# Patient Record
Sex: Male | Born: 1990 | Race: White | Hispanic: No | Marital: Single | Smoking: Never smoker
Health system: Southern US, Community
[De-identification: ages and names within clinical notes are randomized; demographics above are authoritative.]

## PROBLEM LIST (undated history)

## (undated) DIAGNOSIS — J302 Other seasonal allergic rhinitis: Secondary | ICD-10-CM

## (undated) DIAGNOSIS — K760 Fatty (change of) liver, not elsewhere classified: Secondary | ICD-10-CM

## (undated) DIAGNOSIS — K2970 Gastritis, unspecified, without bleeding: Secondary | ICD-10-CM

## (undated) DIAGNOSIS — K219 Gastro-esophageal reflux disease without esophagitis: Secondary | ICD-10-CM

## (undated) DIAGNOSIS — K224 Dyskinesia of esophagus: Secondary | ICD-10-CM

## (undated) HISTORY — DX: Fatty (change of) liver, not elsewhere classified: K76.0

## (undated) HISTORY — DX: Dyskinesia of esophagus: K22.4

## (undated) HISTORY — PX: CIRCUMCISION: SUR203

## (undated) HISTORY — DX: Gastro-esophageal reflux disease without esophagitis: K21.9

## (undated) HISTORY — DX: Gastritis, unspecified, without bleeding: K29.70

---

## 2012-02-17 ENCOUNTER — Encounter (HOSPITAL_BASED_OUTPATIENT_CLINIC_OR_DEPARTMENT_OTHER): Payer: Self-pay | Admitting: *Deleted

## 2012-02-17 ENCOUNTER — Emergency Department (INDEPENDENT_AMBULATORY_CARE_PROVIDER_SITE_OTHER): Payer: Self-pay

## 2012-02-17 ENCOUNTER — Emergency Department (HOSPITAL_BASED_OUTPATIENT_CLINIC_OR_DEPARTMENT_OTHER)
Admission: EM | Admit: 2012-02-17 | Discharge: 2012-02-17 | Disposition: A | Discharge status: Home / Self Care | Payer: Self-pay | Attending: Emergency Medicine | Admitting: Emergency Medicine

## 2012-02-17 DIAGNOSIS — M79609 Pain in unspecified limb: Secondary | ICD-10-CM

## 2012-02-17 DIAGNOSIS — IMO0001 Reserved for inherently not codable concepts without codable children: Secondary | ICD-10-CM | POA: Insufficient documentation

## 2012-02-17 DIAGNOSIS — IMO0002 Reserved for concepts with insufficient information to code with codable children: Secondary | ICD-10-CM | POA: Insufficient documentation

## 2012-02-17 DIAGNOSIS — T148XXA Other injury of unspecified body region, initial encounter: Secondary | ICD-10-CM

## 2012-02-17 DIAGNOSIS — S60229A Contusion of unspecified hand, initial encounter: Secondary | ICD-10-CM | POA: Insufficient documentation

## 2012-02-17 MED ORDER — IBUPROFEN 400 MG PO TABS
600.0000 mg | ORAL_TABLET | Freq: Once | ORAL | Status: AC
  Administered 2012-02-17: 600 mg via ORAL
  Filled 2012-02-17: qty 1
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
## 2013-01-10 ENCOUNTER — Emergency Department (HOSPITAL_COMMUNITY): Payer: Self-pay

## 2013-01-10 ENCOUNTER — Emergency Department (HOSPITAL_COMMUNITY)
Admission: EM | Admit: 2013-01-10 | Discharge: 2013-01-11 | Disposition: A | Discharge status: Home / Self Care | Payer: Self-pay | Attending: Emergency Medicine | Admitting: Emergency Medicine

## 2013-01-10 ENCOUNTER — Encounter (HOSPITAL_COMMUNITY): Payer: Self-pay | Admitting: Emergency Medicine

## 2013-01-10 DIAGNOSIS — J302 Other seasonal allergic rhinitis: Secondary | ICD-10-CM | POA: Insufficient documentation

## 2013-01-10 DIAGNOSIS — K219 Gastro-esophageal reflux disease without esophagitis: Secondary | ICD-10-CM | POA: Insufficient documentation

## 2013-01-10 DIAGNOSIS — R142 Eructation: Secondary | ICD-10-CM | POA: Insufficient documentation

## 2013-01-10 DIAGNOSIS — R079 Chest pain, unspecified: Secondary | ICD-10-CM | POA: Insufficient documentation

## 2013-01-10 DIAGNOSIS — R141 Gas pain: Secondary | ICD-10-CM | POA: Insufficient documentation

## 2013-01-10 DIAGNOSIS — R070 Pain in throat: Secondary | ICD-10-CM | POA: Insufficient documentation

## 2013-01-10 HISTORY — DX: Other seasonal allergic rhinitis: J30.2

## 2013-01-10 LAB — COMPREHENSIVE METABOLIC PANEL
ALT: 51 U/L (ref 0–53)
AST: 43 U/L — ABNORMAL HIGH (ref 0–37)
Alkaline Phosphatase: 66 U/L (ref 39–117)
CO2: 23 mEq/L (ref 19–32)
Chloride: 98 mEq/L (ref 96–112)
GFR calc non Af Amer: >90 mL/min (ref >90)
Glucose, Bld: 103 mg/dL — ABNORMAL HIGH (ref 70–99)
Potassium: 3.9 mEq/L (ref 3.5–5.1)
Sodium: 133 mEq/L — ABNORMAL LOW (ref 135–145)
Total Bilirubin: 0.2 mg/dL — ABNORMAL LOW (ref 0.3–1.2)

## 2013-01-10 LAB — CBC WITH DIFFERENTIAL/PLATELET
Basophils Absolute: 0 10*3/uL (ref 0.0–0.1)
Lymphocytes Relative: 30 % (ref 12–46)
Lymphs Abs: 3.7 10*3/uL (ref 0.7–4.0)
Neutro Abs: 7.2 10*3/uL (ref 1.7–7.7)
Platelets: 305 10*3/uL (ref 150–400)
RBC: 5.64 MIL/uL (ref 4.22–5.81)
RDW: 13.1 % (ref 11.5–15.5)
WBC: 12.5 10*3/uL — ABNORMAL HIGH (ref 4.0–10.5)

## 2013-01-10 LAB — POCT I-STAT TROPONIN I: Troponin i, poc: 0 ng/mL (ref 0.00–0.08)

## 2013-01-10 NOTE — ED Notes (Signed)
Patient complaining of abdominal pain, chest pain, indigestion, and excessive burping.  Denies nausea, vomiting, and diarrhea.

## 2013-01-11 LAB — URINALYSIS, ROUTINE W REFLEX MICROSCOPIC
Bilirubin Urine: NEGATIVE
Glucose, UA: NEGATIVE mg/dL
Hgb urine dipstick: NEGATIVE
Ketones, ur: NEGATIVE mg/dL
Protein, ur: NEGATIVE mg/dL

## 2013-01-11 MED ORDER — PANTOPRAZOLE SODIUM 40 MG IV SOLR
40.0000 mg | Freq: Once | INTRAVENOUS | Status: AC
  Administered 2013-01-11: 40 mg via INTRAVENOUS
  Filled 2013-01-11: qty 40

## 2013-01-11 MED ORDER — SODIUM CHLORIDE 0.9 % IV BOLUS (SEPSIS)
1000.0000 mL | Freq: Once | INTRAVENOUS | Status: AC
  Administered 2013-01-11: 1000 mL via INTRAVENOUS

## 2013-01-11 MED ORDER — FAMOTIDINE 20 MG PO TABS: 20.0000 mg | ORAL_TABLET | Freq: Two times a day (BID) | ORAL | Status: DC

## 2013-01-11 NOTE — ED Provider Notes (Signed)
History     CSN: [id number]  Arrival date & time 01/10/13  2244   First MD Initiated Contact with Patient 01/11/13 0046      Chief Complaint  Patient presents with  . Abdominal Pain  . Chest Pain    (Consider location/radiation/quality/duration/timing/severity/associated sxs/prior treatment) HPI  She presents to the emergency department with complaints of flare of his acid reflux. He says that he has had this problem for years and does not take any medications for it. He states that he used to take some Nexium but he has not been taking it recently. For last 3 days he is having worse than normal epigastric pain, burping, gas, throat burning. He has been having a very acid the entire left face and the back of his throat with a burning. He says that sometimes he feels like he needs to clear his throat because got something in it has not had any cough, cold symptoms, nausea, vomiting, diarrhea or lower abdominal pains. He has not had any weakness or fevers.  Past Medical History  Diagnosis Date  . Seasonal allergies     Past Surgical History  Procedure Date  . Circumcision     History reviewed. No pertinent family history.  History  Substance Use Topics  . Smoking status: Never Smoker   . Smokeless tobacco: Not on file  . Alcohol Use: Yes      Review of Systems  All other systems reviewed and are negative.      Allergies  Review of patient's allergies indicates no known allergies.  Home Medications   Current Outpatient Rx  Name  Route  Sig  Dispense  Refill  . IBUPROFEN 200 MG PO TABS   Oral   Take 600 mg by mouth every 6 (six) hours as needed. For pain           BP 125/76  Pulse 97  Physical Exam  Nursing note and vitals reviewed. Constitutional: He appears well-developed and well-nourished. No distress.  HENT:  Head: Normocephalic and atraumatic.  Eyes: Pupils are equal, round, and reactive to light.  Neck: Normal range of motion. Neck supple.    Cardiovascular: Normal rate and regular rhythm.   Pulmonary/Chest: Effort normal.  Abdominal: Soft.  Neurological: He is alert.  Skin: Skin is warm and dry.    ED Course  Procedures (including critical care time)  Labs Reviewed  CBC WITH DIFFERENTIAL - Abnormal; Notable for the following:    WBC 12.5 (*)     Monocytes Absolute 1.2 (*)     All other components within normal limits  COMPREHENSIVE METABOLIC PANEL - Abnormal; Notable for the following:    Sodium 133 (*)     Glucose, Bld 103 (*)     AST 43 (*)     Total Bilirubin 0.2 (*)     All other components within normal limits  LIPASE, BLOOD  URINALYSIS, ROUTINE W REFLEX MICROSCOPIC  POCT I-STAT TROPONIN I   Dg Chest 2 View  01/10/2013  *RADIOLOGY REPORT*  Clinical Data: Chest and abdominal pain; weakness and nausea.  CHEST - 2 VIEW  Comparison: None.  Findings: The lungs are well-aerated and clear.  There is no evidence of focal opacification, pleural effusion or pneumothorax.  The heart is normal in size; the mediastinal contour is within normal limits.  No acute osseous abnormalities are seen.  IMPRESSION: No acute cardiopulmonary process seen.   Original Report Authenticated By: Santa Lighter, M.D.      No  diagnosis found. Dx: Acid reflux   MDM   Date: 01/11/2013  Rate: 87  Rhythm: normal sinus rhythm  QRS Axis: normal  Intervals: normal  ST/T Wave abnormalities: normal  Conduction Disutrbances:none  Narrative Interpretation:   Old EKG Reviewed: none available  EKG, chest x-ray, troponin and other labs are unremarkable. Patient says he feels a bit better after IV fluids and protonix.  Patient is not having chest pain or abdominal pain. He is having epigastric and throat pain from his GERD. He burps during my examination and describes having by stoamch acid he feeling in the back of his throat. He also expresses having the desire to vomit. The symptoms have been going on intermittently for years and this current  episode that brought him and has been going on for 3 days. Respiratory or cardiac symptoms and no risk factors for cardiac symptoms.  Patient given IV fluids and IV 40 mg Protonix. Discussed how important is that he followup with a gastroenterologist because he most likely needs an endoscopy to evaluate his severe GERD.  Patient discharged with a prescription for Pepcid.  Pt has been advised of the symptoms that warrant their return to the ED. Patient has voiced understanding and has agreed to follow-up with the PCP or specialist.        Linus Mako, [place] 01/11/13 [phone]

## 2013-01-11 NOTE — ED Provider Notes (Signed)
Medical screening examination/treatment/procedure(s) were performed by non-physician practitioner and as supervising physician I was immediately available for consultation/collaboration.  Teressa Lower, MD 01/11/13 [phone]

## 2013-01-13 ENCOUNTER — Encounter: Payer: Self-pay | Admitting: Internal Medicine

## 2013-01-20 ENCOUNTER — Encounter: Payer: Self-pay | Admitting: Internal Medicine

## 2013-01-22 ENCOUNTER — Ambulatory Visit (INDEPENDENT_AMBULATORY_CARE_PROVIDER_SITE_OTHER): Payer: BC Managed Care – PPO | Admitting: Internal Medicine

## 2013-01-22 ENCOUNTER — Encounter: Payer: Self-pay | Admitting: Internal Medicine

## 2013-01-22 VITALS — BP 124/80 | HR 67 | Ht 71.0 in | Wt 259.0 lb

## 2013-01-22 DIAGNOSIS — R1310 Dysphagia, unspecified: Secondary | ICD-10-CM

## 2013-01-22 DIAGNOSIS — R1013 Epigastric pain: Secondary | ICD-10-CM

## 2013-01-22 DIAGNOSIS — K219 Gastro-esophageal reflux disease without esophagitis: Secondary | ICD-10-CM

## 2013-01-22 DIAGNOSIS — K3189 Other diseases of stomach and duodenum: Secondary | ICD-10-CM

## 2013-01-22 MED ORDER — PANTOPRAZOLE SODIUM 40 MG PO TBEC: 40.0000 mg | DELAYED_RELEASE_TABLET | Freq: Every day | ORAL | Status: DC

## 2013-01-22 NOTE — Progress Notes (Signed)
Patient ID: Ian Rojas, male   DOB: 23-May-1991, 22 y.o.   MRN: [id number]  SUBJECTIVE: Ian Rojas is a 22 yo male with little PMH who is seen evaluation of heartburn and epigastric abdominal pain. The patient reports heartburn on off over a 2 year period.  It has been significantly worse over the last 5-6 months. He reports heartburn on a daily basis, associated with water brash and belching. He had previously used Nexium on a very sporadic basis, given him by his grandmother, he did not find this very helpful. His pain worsened and he presented to the ER where he was evaluated and given a prescription for Pepcid 20 mg. He's been using this 2-3 times daily and has found it to be helpful. He still reports globus sensation which is a constant phenomenon and also dysphagia or, solids greater than liquids. He does report some mild nausea but no vomiting. Eating definitely makes his symptoms worse, and he has changed his diet to avoid acidic foods such as tomatoes.  Overeating also worsens his heartburn. Separate from his heartburn he reports epigastric and right upper quadrant pain is worse after eating. This pain can be quite severe. He has been using baking soda with water in the morning to help his symptoms. No odynophagia. No fevers or chills. No black or tarry stools.  Review of Systems  As per history of present illness, otherwise negative   Past Medical History  Diagnosis Date  . Seasonal allergies   . GERD (gastroesophageal reflux disease)     Current Outpatient Prescriptions  Medication Sig Dispense Refill  . famotidine (PEPCID) 20 MG tablet Take 1 tablet (20 mg total) by mouth 2 (two) times daily.  30 tablet  0  . pantoprazole (PROTONIX) 40 MG tablet Take 1 tablet (40 mg total) by mouth daily.  60 tablet  3    No Known Allergies  Family History  Problem Relation Age of Onset  . Colon cancer Neg Hx   . Colitis Father     History  Substance Use Topics  . Smoking status: Never  Smoker   . Smokeless tobacco: Never Used  . Alcohol Use: No    OBJECTIVE: BP 124/80  Pulse 67  Ht 5' 11"  (1.803 m)  Wt 259 lb (117.482 kg)  BMI 36.12 kg/m2 Constitutional: Well-developed and well-nourished. No distress. HEENT: Normocephalic and atraumatic. Oropharynx is clear and moist. No oropharyngeal exudate. Conjunctivae are normal. No scleral icterus. Neck: Neck supple. Trachea midline. Cardiovascular: Normal rate, regular rhythm and intact distal pulses. No M/R/G Pulmonary/chest: Effort normal and breath sounds normal. No wheezing, rales or rhonchi. Abdominal: Soft, mild upper abdominal tenderness without rebound or guarding, nondistended. Bowel sounds active throughout. There are no masses palpable. No hepatosplenomegaly. Extremities: no clubbing, cyanosis, or edema Lymphadenopathy: No cervical adenopathy noted. Neurological: Alert and oriented to person place and time. Skin: Skin is warm and dry. No rashes noted. Psychiatric: Normal mood and affect. Behavior is normal.  Labs and Imaging -- CBC    Component Value Date/Time   WBC 12.5* 01/10/2013 2304   RBC 5.64 01/10/2013 2304   HGB 15.7 01/10/2013 2304   HCT 44.9 01/10/2013 2304   PLT 305 01/10/2013 2304   MCV 79.6 01/10/2013 2304   MCH 27.8 01/10/2013 2304   MCHC 35.0 01/10/2013 2304   RDW 13.1 01/10/2013 2304   LYMPHSABS 3.7 01/10/2013 2304   MONOABS 1.2* 01/10/2013 2304   EOSABS 0.4 01/10/2013 2304   BASOSABS 0.0 01/10/2013 2304  CMP     Component Value Date/Time   NA 133* 01/10/2013 2304   K 3.9 01/10/2013 2304   CL 98 01/10/2013 2304   CO2 23 01/10/2013 2304   GLUCOSE 103* 01/10/2013 2304   BUN 11 01/10/2013 2304   CREATININE 0.68 01/10/2013 2304   CALCIUM 9.4 01/10/2013 2304   PROT 8.2 01/10/2013 2304   ALBUMIN 4.3 01/10/2013 2304   AST 43* 01/10/2013 2304   ALT 51 01/10/2013 2304   ALKPHOS 66 01/10/2013 2304   BILITOT 0.2* 01/10/2013 2304   GFRNONAA >90 01/10/2013 2304   GFRAA >90 01/10/2013 2304    CXR: no acute  disease  ASSESSMENT AND PLAN: 22 year old seen for evaluation of GERD and ulcer-like dyspepsia  1.  GERD/dyspepsia -- he doesn't have symptoms consistent with GERD and epigastric abdominal pain consistent with ulcer-like dyspepsia. Given his dysphagia symptoms and epigastric, pain, I recommended upper endoscopy.  This will help rule out esophagitis gastritis, duodenitis and ulcer disease.  Will also allow [place] to biopsy for H. pylori, should inflammation be seen. I would like to start him on pantoprazole 40 mg twice daily. He is instructed to take this 30 minutes to one hour before his first and last meal of the day. We did discuss GERD hygiene including not eating or drinking within 2 hours of lying down, avoiding acidic foods, and other trigger foods such as caffeine and alcohol.  GERD diet handout provided. Continue twice daily PPI until EGD, and at that point we likely can decrease to once daily. He will hold Pepcid for now we can be used for breakthrough symptoms.

## 2013-01-22 NOTE — Patient Instructions (Addendum)
You have been scheduled for an endoscopy with propofol. Please follow written instructions given to you at your visit today. If you use inhalers (even only as needed) or a CPAP machine, please bring them with you on the day of your procedure.  We have sent the following medications to your pharmacy for you to pick up at your convenience: Protonix, take 1 capsule 30 minutes before first meal of the day and 1 capsule before dinner  Diet for Gastroesophageal Reflux Disease, Adult Reflux (acid reflux) is when acid from your stomach flows up into the esophagus. When acid comes in contact with the esophagus, the acid causes irritation and soreness (inflammation) in the esophagus. When reflux happens often or so severely that it causes damage to the esophagus, it is called gastroesophageal reflux disease (GERD). Nutrition therapy can help ease the discomfort of GERD. FOODS OR DRINKS TO AVOID OR LIMIT  Smoking or chewing tobacco. Nicotine is one of the most potent stimulants to acid production in the gastrointestinal tract.  Caffeinated and decaffeinated coffee and black tea.  Regular or low-calorie carbonated beverages or energy drinks (caffeine-free carbonated beverages are allowed).   Strong spices, such as black pepper, white pepper, red pepper, cayenne, curry powder, and chili powder.  Peppermint or spearmint.  Chocolate.  High-fat foods, including meats and fried foods. Extra added fats including oils, butter, salad dressings, and nuts. Limit these to less than 8 tsp per day.  Fruits and vegetables if they are not tolerated, such as citrus fruits or tomatoes.  Alcohol.  Any food that seems to aggravate your condition. If you have questions regarding your diet, call your caregiver or a registered dietitian. OTHER THINGS THAT MAY HELP GERD INCLUDE:   Eating your meals slowly, in a relaxed setting.  Eating 5 to 6 small meals per day instead of 3 large meals.  Eliminating food for a  period of time if it causes distress.  Not lying down until 3 hours after eating a meal.  Keeping the head of your bed raised 6 to 9 inches (15 to 23 cm) by using a foam wedge or blocks under the legs of the bed. Lying flat may make symptoms worse.  Being physically active. Weight loss may be helpful in reducing reflux in overweight or obese adults.  Wear loose fitting clothing EXAMPLE MEAL PLAN This meal plan is approximately 2,000 calories based on [email or web address] meal planning guidelines. Breakfast   cup cooked oatmeal.  1 cup strawberries.  1 cup low-fat milk.  1 oz almonds. Snack  1 cup cucumber slices.  6 oz yogurt (made from low-fat or fat-free milk). Lunch  2 slice whole-wheat bread.  2 oz sliced [place].  2 tsp mayonnaise.  1 cup blueberries.  1 cup snap peas. Snack  6 whole-wheat crackers.  1 oz string cheese. Dinner   cup brown rice.  1 cup mixed veggies.  1 tsp olive oil.  3 oz grilled fish. Document Released: 12/04/2005 Document Revised: 02/26/2012 Document Reviewed: 10/20/2011 [facility] Patient Information 2013 [place].

## 2013-01-23 ENCOUNTER — Other Ambulatory Visit: Payer: Self-pay | Admitting: Gastroenterology

## 2013-01-23 DIAGNOSIS — R1310 Dysphagia, unspecified: Secondary | ICD-10-CM

## 2013-01-23 DIAGNOSIS — R1013 Epigastric pain: Secondary | ICD-10-CM

## 2013-01-23 MED ORDER — PANTOPRAZOLE SODIUM 40 MG PO TBEC: 40.0000 mg | DELAYED_RELEASE_TABLET | Freq: Every day | ORAL | Status: DC

## 2013-02-03 ENCOUNTER — Encounter: Payer: Self-pay | Admitting: Internal Medicine

## 2013-02-03 ENCOUNTER — Ambulatory Visit (AMBULATORY_SURGERY_CENTER): Payer: BC Managed Care – PPO | Admitting: Internal Medicine

## 2013-02-03 VITALS — BP 133/75 | HR 66 | Temp 96.7°F | Resp 18 | Ht 71.0 in | Wt 259.0 lb

## 2013-02-03 DIAGNOSIS — D130 Benign neoplasm of esophagus: Secondary | ICD-10-CM

## 2013-02-03 DIAGNOSIS — R1310 Dysphagia, unspecified: Secondary | ICD-10-CM

## 2013-02-03 DIAGNOSIS — K2990 Gastroduodenitis, unspecified, without bleeding: Secondary | ICD-10-CM

## 2013-02-03 DIAGNOSIS — K2970 Gastritis, unspecified, without bleeding: Secondary | ICD-10-CM

## 2013-02-03 DIAGNOSIS — K219 Gastro-esophageal reflux disease without esophagitis: Secondary | ICD-10-CM

## 2013-02-03 DIAGNOSIS — K3189 Other diseases of stomach and duodenum: Secondary | ICD-10-CM

## 2013-02-03 DIAGNOSIS — R1013 Epigastric pain: Secondary | ICD-10-CM

## 2013-02-03 MED ORDER — SODIUM CHLORIDE 0.9 % IV SOLN: 500.0000 mL | INTRAVENOUS | Status: DC

## 2013-02-03 NOTE — Op Note (Signed)
[place]  [address] [place], [address]   ENDOSCOPY PROCEDURE REPORT  PATIENT: Ian, Rojas  MR#: [id number] BIRTHDATE: 1991-05-12 , 21  yrs. old GENDER: Male ENDOSCOPIST: Jerene Bears, MD PROCEDURE DATE:  02/03/2013 PROCEDURE:  EGD w/ biopsy ASA CLASS:     Class I INDICATIONS:  Heartburn.   Epigastric pain.   Dysphagia. MEDICATIONS: MAC sedation, administered by CRNA and propofol (Diprivan) 446m IV TOPICAL ANESTHETIC: Cetacaine Spray  DESCRIPTION OF PROCEDURE: After the risks benefits and alternatives of the procedure were thoroughly explained, informed consent was obtained.  The LB GIF-H180 [id number] was introduced through the mouth and advanced to the second portion of the duodenum. Without limitations.  The instrument was slowly withdrawn as the mucosa was fully examined.    ESOPHAGUS: The mucosa of the esophagus appeared normal.  Multiple biopsies were taken in the distal and mid esophagus to rule out eosinophilic esophagitis.   A normal Z-line was observed 40 cm from the incisors.   There was reflux of gastric fluid noted during the exam.  STOMACH: Mild erosive gastritis (inflammation) was found in the gastric antrum.  Multiple biopsies were performed using cold forceps.  Sample sent for histology.   Stomach otherwise normal.  DUODENUM: The duodenal mucosa showed no abnormalities in the bulb and second portion of the duodenum.  Retroflexed views revealed no abnormalities.     The scope was then withdrawn from the patient and the procedure completed.  COMPLICATIONS: There were no complications.  ENDOSCOPIC IMPRESSION: 1.   The mucosa of the esophagus appeared normal; multiple biopsies were taken in the distal and mid esophagus to rule out eosinophilic esophagitis 2.   Normal Z-line was observed 40 cm from the incisors 3.   Mild erosive gastritis (inflammation) was found in the gastric antrum; multiple biopsies 4.   The  duodenal mucosa showed no abnormalities in the bulb and second portion of the duodenum      RECOMMENDATIONS: 1.  Await pathology results 2.  Follow-up of helicobacter pylori status, treat if indicated 3.  Continue taking your PPI (pantoprazole) twice daily for a total of 4-8 weeks.  It is best to be taken 20-30 minutes prior to first and last meal of the day.  After this you can switch to once daily dosing to control acid reflux (heartburn) 4.  Outpatient follow-up is advised on an as needed basis.  eSigned:  JJerene Bears MD 02/03/2013 8:40 AM  CC:The Patient  PATIENT NAME:  Ian, KowalewskiMR#: [id number]

## 2013-02-03 NOTE — Progress Notes (Signed)
Patient did not experience any of the following events: a burn prior to discharge; a fall within the facility; wrong site/side/patient/procedure/implant event; or a hospital transfer or hospital admission upon discharge from the facility. (G8907) Patient did not have preoperative order for IV antibiotic SSI prophylaxis. (G8918)  

## 2013-02-03 NOTE — Progress Notes (Signed)
PT WILL NEED A WORK NOTE FOR TODAY. PT STATES HE HAS TO WORK TODAY, PT ADVISED AGAINST THIS DUE TO SEDATION.  EWM PT NOT ALLERGIC TO EGGS OR SOY. EWM

## 2013-02-03 NOTE — Patient Instructions (Addendum)
YOU HAD AN ENDOSCOPIC PROCEDURE TODAY AT [facility]: Refer to the procedure report that was given to you for any specific questions about what was found during the examination.  If the procedure report does not answer your questions, please call your gastroenterologist to clarify.  If you requested that your care partner not be given the details of your procedure findings, then the procedure report has been included in a sealed envelope for you to review at your convenience later.  YOU SHOULD EXPECT: Some feelings of bloating in the abdomen. Passage of more gas than usual.  Walking can help get rid of the air that was put into your GI tract during the procedure and reduce the bloating. If you had a lower endoscopy (such as a colonoscopy or flexible sigmoidoscopy) you may notice spotting of blood in your stool or on the toilet paper. If you underwent a bowel prep for your procedure, then you may not have a normal bowel movement for a few days.  DIET: Your first meal following the procedure should be a light meal and then it is ok to progress to your normal diet.  A half-sandwich or bowl of soup is an example of a good first meal.  Heavy or fried foods are harder to digest and may make you feel nauseous or bloated.  Likewise meals heavy in dairy and vegetables can cause extra gas to form and this can also increase the bloating.  Drink plenty of fluids but you should avoid alcoholic beverages for 24 hours.  ACTIVITY: Your care partner should take you home directly after the procedure.  You should plan to take it easy, moving slowly for the rest of the day.  You can resume normal activity the day after the procedure however you should NOT DRIVE or use heavy machinery for 24 hours (because of the sedation medicines used during the test).    SYMPTOMS TO REPORT IMMEDIATELY: A gastroenterologist can be reached at any hour.  During normal business hours, 8:30 AM to 5:00 PM Monday through Friday,  call [phone].  After hours and on weekends, please call the GI answering service at [phone] who will take a message and have the physician on call contact you.    Following upper endoscopy (EGD)  Vomiting of blood or coffee ground material  New chest pain or pain under the shoulder blades  Painful or persistently difficult swallowing  New shortness of breath  Fever of 100F or higher  Black, tarry-looking stools  FOLLOW UP: If any biopsies were taken you will be contacted by phone or by letter within the next 1-3 weeks.  Call your gastroenterologist if you have not heard about the biopsies in 3 weeks.  Our staff will call the home number listed on your records the next business day following your procedure to check on you and address any questions or concerns that you may have at that time regarding the information given to you following your procedure. This is a courtesy call and so if there is no answer at the home number and we have not heard from you through the emergency physician on call, we will assume that you have returned to your regular daily activities without incident.  Resume medications. Information given on gastritis with discharge instructions.  SIGNATURES/CONFIDENTIALITY: You and/or your care partner have signed paperwork which will be entered into your electronic medical record.  These signatures attest to the fact that that the information above on your After  Visit Summary has been reviewed and is understood.  Full responsibility of the confidentiality of this discharge information lies with you and/or your care-partner.

## 2013-02-03 NOTE — Progress Notes (Signed)
Called to room to assist during endoscopic procedure.  Patient ID and intended procedure confirmed with present staff. Received instructions for my participation in the procedure from the performing physician.  

## 2013-02-04 ENCOUNTER — Telehealth: Payer: Self-pay | Admitting: *Deleted

## 2013-02-04 NOTE — Telephone Encounter (Signed)
No answer, no voice mail sent up per recording.

## 2013-02-07 ENCOUNTER — Encounter: Payer: Self-pay | Admitting: Internal Medicine

## 2013-05-30 ENCOUNTER — Other Ambulatory Visit: Payer: Self-pay | Admitting: Internal Medicine

## 2013-06-19 ENCOUNTER — Other Ambulatory Visit: Payer: Self-pay | Admitting: Physician Assistant

## 2013-06-19 DIAGNOSIS — R109 Unspecified abdominal pain: Secondary | ICD-10-CM

## 2013-06-24 ENCOUNTER — Ambulatory Visit
Admission: RE | Admit: 2013-06-24 | Discharge: 2013-06-24 | Disposition: A | Discharge status: Home / Self Care | Payer: BC Managed Care – PPO | Source: Ambulatory Visit | Attending: Physician Assistant | Admitting: Physician Assistant

## 2013-06-24 ENCOUNTER — Other Ambulatory Visit: Payer: Self-pay | Admitting: Physician Assistant

## 2013-06-24 DIAGNOSIS — R109 Unspecified abdominal pain: Secondary | ICD-10-CM

## 2013-07-22 ENCOUNTER — Other Ambulatory Visit: Payer: Self-pay | Admitting: Gastroenterology

## 2013-07-22 DIAGNOSIS — K760 Fatty (change of) liver, not elsewhere classified: Secondary | ICD-10-CM

## 2013-07-22 DIAGNOSIS — R1012 Left upper quadrant pain: Secondary | ICD-10-CM

## 2013-07-28 ENCOUNTER — Ambulatory Visit
Admission: RE | Admit: 2013-07-28 | Discharge: 2013-07-28 | Disposition: A | Discharge status: Home / Self Care | Payer: BC Managed Care – PPO | Source: Ambulatory Visit | Attending: Gastroenterology | Admitting: Gastroenterology

## 2013-07-28 DIAGNOSIS — R1012 Left upper quadrant pain: Secondary | ICD-10-CM

## 2013-07-28 DIAGNOSIS — K760 Fatty (change of) liver, not elsewhere classified: Secondary | ICD-10-CM

## 2013-10-15 ENCOUNTER — Other Ambulatory Visit: Payer: Self-pay | Admitting: Internal Medicine

## 2015-01-20 ENCOUNTER — Encounter (HOSPITAL_BASED_OUTPATIENT_CLINIC_OR_DEPARTMENT_OTHER): Payer: Self-pay | Admitting: *Deleted

## 2015-01-20 ENCOUNTER — Emergency Department (HOSPITAL_BASED_OUTPATIENT_CLINIC_OR_DEPARTMENT_OTHER)
Admission: EM | Admit: 2015-01-20 | Discharge: 2015-01-20 | Disposition: A | Discharge status: Home / Self Care | Payer: No Typology Code available for payment source | Attending: Emergency Medicine | Admitting: Emergency Medicine

## 2015-01-20 DIAGNOSIS — Z79899 Other long term (current) drug therapy: Secondary | ICD-10-CM | POA: Diagnosis not present

## 2015-01-20 DIAGNOSIS — J029 Acute pharyngitis, unspecified: Secondary | ICD-10-CM | POA: Diagnosis present

## 2015-01-20 DIAGNOSIS — R198 Other specified symptoms and signs involving the digestive system and abdomen: Secondary | ICD-10-CM

## 2015-01-20 DIAGNOSIS — K219 Gastro-esophageal reflux disease without esophagitis: Secondary | ICD-10-CM | POA: Insufficient documentation

## 2015-01-20 DIAGNOSIS — K228 Other specified diseases of esophagus: Secondary | ICD-10-CM

## 2015-01-20 DIAGNOSIS — R0989 Other specified symptoms and signs involving the circulatory and respiratory systems: Secondary | ICD-10-CM | POA: Insufficient documentation

## 2015-01-20 DIAGNOSIS — Z8709 Personal history of other diseases of the respiratory system: Secondary | ICD-10-CM | POA: Diagnosis not present

## 2015-01-20 NOTE — ED Provider Notes (Signed)
CSN: [id number]     Arrival date & time 01/20/15  1916 History  This chart was scribed for Ian Beards, MD by Tula Nakayama, ED Scribe. This patient was seen in room MH08/MH08 and the patient's care was started at 8:00 PM.    Chief Complaint  Patient presents with  . Sore Throat   Patient is a 24 y.o. male presenting with foreign body. The history is provided by the patient. No language interpreter was used.  Foreign Body Location:  Swallowed Suspected object:  Food Pain quality:  Aching Pain severity:  Mild Timing:  Constant Progression:  Unchanged Chronicity:  Recurrent Ineffective treatments:  Drinking Associated symptoms: sore throat and trouble swallowing   Associated symptoms: no choking and no difficulty breathing   Risk factors: prior similar events     HPI Comments: Ian Rojas is a 24 y.o. male who presents to the Emergency Department complaining of intermittent episodes of difficulty swallowing that started a few months ago and constant, mild throat pain that started today. He states that he was eating Cheetos for breakfast earlier today when a chip became lodged in his esophagus. He notes a history of similar symptoms that typically improve with fluid intake, but states that drinking provided no relief today. Pt reports 20-lb weight loss over the last 1.5 months as an associated symptom.  He denies difficulty swallowing fluids. He also denies choking and difficulty breathing as associated symptoms.   Past Medical History  Diagnosis Date  . Seasonal allergies   . GERD (gastroesophageal reflux disease)    Past Surgical History  Procedure Laterality Date  . Circumcision     Family History  Problem Relation Age of Onset  . Colon cancer Neg Hx   . Colitis Father     AS CHILD   History  Substance Use Topics  . Smoking status: Never Smoker   . Smokeless tobacco: Never Used  . Alcohol Use: Yes     Comment: OCCASIONAL-VERY RARE    Review of Systems   HENT: Positive for sore throat and trouble swallowing.   Respiratory: Negative for choking.   ROS reviewed and all otherwise negative except for mentioned in HPI    Allergies  Review of patient's allergies indicates no known allergies.  Home Medications   Prior to Admission medications   Medication Sig Start Date End Date Taking? Authorizing Provider  famotidine (PEPCID) 20 MG tablet Take 1 tablet (20 mg total) by mouth 2 (two) times daily. 01/11/13   Tiffany Marilu Favre, PA-C  pantoprazole (PROTONIX) 40 MG tablet TAKE 1 TABLET BY MOUTH EVERY DAY 10/15/13   Jerene Bears, MD   BP 127/80 mmHg  Pulse 92  Temp(Src) 98.8 F (37.1 C) (Oral)  Resp 16  Ht 6' (1.829 m)  Wt 147 lb (66.679 kg)  BMI 19.93 kg/m2  SpO2 100% Physical Exam  Constitutional: He appears well-developed and well-nourished. No distress.  HENT:  Head: Normocephalic and atraumatic.  Mouth/Throat: Oropharynx is clear and moist.  Eyes: Conjunctivae and EOM are normal.  Neck: Neck supple. No tracheal deviation present.  Cardiovascular: Normal rate and regular rhythm.   Pulmonary/Chest: Effort normal and breath sounds normal. No respiratory distress.  Skin: Skin is warm and dry.  Psychiatric: He has a normal mood and affect. His behavior is normal.  Nursing note and vitals reviewed.   ED Course  Procedures (including critical care time) DIAGNOSTIC STUDIES: Oxygen Saturation is 100% on RA, normal by my interpretation.    COORDINATION  OF CARE: 8:09 PM Discussed treatment plan with pt at bedside and pt agreed to plan. Will refer to GI for follow-up.  Labs Review Labs Reviewed - No data to display  Imaging Review No results found.   EKG Interpretation None      MDM   Final diagnoses:  Sensation of foreign body in esophagus   Pt presenting with sensation of foreign body in his esophagus- he states he has felt this since eating a cheeto this morning.  Over the past several weeks he has felt that swallowing  food items has been difficult for him.  No difficulty drinking liquids today.  No difficulty breathing or choking.  PE is normal.  Advised f/u with GI for possible endoscopy.   Discharged with strict return precautions.  Pt agreeable with plan.  I personally performed the services described in this documentation, which was scribed in my presence. The recorded information has been reviewed and is accurate.    Ian Beards, MD 01/20/15 [phone]

## 2015-01-20 NOTE — ED Notes (Signed)
Pt c/o difficulty swallowing x 10 hrs . Hx of same x 1 month pt drinking coffee at triage

## 2015-01-20 NOTE — Discharge Instructions (Signed)
Return to the ED with any concerns including difficulty breathing, not able to swallow liquids, vomiting and not able to keep down liquids, decreased level of alertness/lethargy, or any other alarming symptoms

## 2015-01-20 NOTE — ED Notes (Signed)
MD at bedside. 

## 2015-01-22 ENCOUNTER — Encounter: Payer: Self-pay | Admitting: *Deleted

## 2015-01-25 ENCOUNTER — Ambulatory Visit (INDEPENDENT_AMBULATORY_CARE_PROVIDER_SITE_OTHER): Payer: No Typology Code available for payment source | Admitting: Internal Medicine

## 2015-01-25 ENCOUNTER — Encounter: Payer: Self-pay | Admitting: Internal Medicine

## 2015-01-25 VITALS — BP 120/70 | HR 82 | Ht 72.0 in | Wt 248.0 lb

## 2015-01-25 DIAGNOSIS — R0989 Other specified symptoms and signs involving the circulatory and respiratory systems: Secondary | ICD-10-CM

## 2015-01-25 DIAGNOSIS — F458 Other somatoform disorders: Secondary | ICD-10-CM

## 2015-01-25 DIAGNOSIS — K219 Gastro-esophageal reflux disease without esophagitis: Secondary | ICD-10-CM

## 2015-01-25 DIAGNOSIS — R1310 Dysphagia, unspecified: Secondary | ICD-10-CM

## 2015-01-25 MED ORDER — PANTOPRAZOLE SODIUM 40 MG PO TBEC: 40.0000 mg | DELAYED_RELEASE_TABLET | Freq: Every day | ORAL | Status: DC

## 2015-01-25 NOTE — Patient Instructions (Addendum)
We have sent the following medications to your pharmacy for you to pick up at your convenience: Pantoprazole 40 mg daily. You may increase to twice daily when needed.   You have been scheduled for a Barium Esophogram at [facility] (1st floor of the hospital) on Thursday, 01/28/15 at 10:30 am. Please arrive 15 minutes prior to your appointment for registration. Make certain not to have anything to eat or drink 6 hours prior to your test. If you need to reschedule for any reason, please contact radiology at [phone] to do so. __________________________________________________________________ A barium swallow is an examination that concentrates on views of the esophagus. This tends to be a double contrast exam (barium and two liquids which, when combined, create a gas to distend the wall of the oesophagus) or single contrast (non-ionic iodine based). The study is usually tailored to your symptoms so a good history is essential. Attention is paid during the study to the form, structure and configuration of the esophagus, looking for functional disorders (such as aspiration, dysphagia, achalasia, motility and reflux) EXAMINATION You may be asked to change into a gown, depending on the type of swallow being performed. A radiologist and radiographer will perform the procedure. The radiologist will advise you of the type of contrast selected for your procedure and direct you during the exam. You will be asked to stand, sit or lie in several different positions and to hold a small amount of fluid in your mouth before being asked to swallow while the imaging is performed .In some instances you may be asked to swallow barium coated marshmallows to assess the motility of a solid food bolus. The exam can be recorded as a digital or video fluoroscopy procedure. POST PROCEDURE It will take 1-2 days for the barium to pass through your system. To facilitate this, it is important, unless otherwise directed,  to increase your fluids for the next 24-48hrs and to resume your normal diet.  This test typically takes about 30 minutes to perform. __________________________________________________________________________________  Please eat soft foods and make sure to chew your food well.

## 2015-01-25 NOTE — Progress Notes (Signed)
Subjective:    Patient ID: Ian Rojas, male    DOB: April 04, 1991, 24 y.o.   MRN: [id number]  HPI Ian Rojas is a 24 yo male with PMH of GERD with dysphagia who is seen for follow-up. He was initially seen in February 2014 with epigastric abdominal pain and heartburn. He was also having dysphagia at that time. After this visit upper endoscopy was performed on 02/03/2013. This revealed normal esophageal mucosa with random biopsies to exclude eosinophilic esophagitis. There was mild erosive gastritis in the antrum and otherwise normal-appearing stomach. Normal examined duodenum. Biopsies of the esophagus were normal with no evidence of eosinophilic esophagitis. Gastric biopsies showed chronic focally active gastritis without H. pylori, metaplasia or dysplasia or malignancy. PPI was started and he took pantoprazole 40 mg twice daily for some time. Approximately 3 months ago he weaned off PPI thinking he may no longer needed. He was taking it as needed for 4-6 weeks but no more than 6 times in that 6 weeks. He then developed globus sensation in solid food dysphagia on 3 or 4 separate occasions. Initially approximate 6 weeks ago while eating pizza he felt food stick in his esophagus. This again happened while eating chicken wings and most recently after eating a Cheeto. This was anxiety provoking for him and so he started himself back on pantoprazole 80 mg in the morning. He has significantly avoided solid foods and has been eating a lot of soup and drinking fluids. He has lost approximately 11 pounds by our scales but he feels it is more like 20. He denies odynophagia. He denies abdominal pain. Reports no change in bowel habit including no diarrhea or constipation. Previously he was having loose stools for a short time but this resolved. He denies blood in his stool or melena.   Review of Systems As per history of present illness, otherwise negative  Current Medications, Allergies, Past Medical  History, Past Surgical History, Family History and Social History were reviewed in [employer] record.     Objective:   Physical Exam BP 120/70 mmHg  Pulse 82  Ht 6' (1.829 m)  Wt 248 lb (112.492 kg)  BMI 33.63 kg/m2  SpO2 98% Constitutional: Well-developed and well-nourished. No distress. HEENT: Normocephalic and atraumatic. Oropharynx is clear and moist. No oropharyngeal exudate. Conjunctivae are normal.  No scleral icterus. Neck: Neck supple. Trachea midline. Cardiovascular: Normal rate, regular rhythm and intact distal pulses. No M/R/G Pulmonary/chest: Effort normal and breath sounds normal. No wheezing, rales or rhonchi. Abdominal: Soft, nontender, nondistended. Bowel sounds active throughout. There are no masses palpable. No hepatosplenomegaly. Extremities: no clubbing, cyanosis, or edema Neurological: Alert and oriented to person place and time. Skin: Skin is warm and dry. No rashes noted. Psychiatric: Normal mood and affect. Behavior is normal.  EGD and path reviewed including with the patient       Assessment & Plan:  23 yo male with PMH of GERD with dysphagia who is seen for follow-up.   1. Esophageal dysphagia -- most likely due to edema/inflammation in the esophagus from uncontrolled reflux disease. He stopped his PPI and then symptoms again. I would like to evaluate general esophageal motility and rule out stricture with barium esophagram with tablet. Should there be any evidence of stricture, EGD would be recommended with probable dilation. I asked that he decreased pantoprazole to 40 mg each morning and follow a GERD diet. He can resume soft solid foods and chew meats very well. He can use an additional  40 mg dose of pantoprazole prior to dinner should he have breakthrough heartburn or plan to eat a meal that his reflux inducing such as pizza. He voices understanding and is happy with this plan. The past he did try famotidine without much benefit.  Further recommendations after barium esophagram. Return in 3 months, sooner if necessary

## 2015-01-28 ENCOUNTER — Telehealth: Payer: Self-pay | Admitting: *Deleted

## 2015-01-28 ENCOUNTER — Ambulatory Visit (HOSPITAL_COMMUNITY)
Admission: RE | Admit: 2015-01-28 | Discharge: 2015-01-28 | Disposition: A | Discharge status: Home / Self Care | Payer: No Typology Code available for payment source | Source: Ambulatory Visit | Attending: Internal Medicine | Admitting: Internal Medicine

## 2015-01-28 DIAGNOSIS — R1310 Dysphagia, unspecified: Secondary | ICD-10-CM | POA: Insufficient documentation

## 2015-01-28 DIAGNOSIS — R0989 Other specified symptoms and signs involving the circulatory and respiratory systems: Secondary | ICD-10-CM

## 2015-01-28 DIAGNOSIS — F458 Other somatoform disorders: Secondary | ICD-10-CM | POA: Diagnosis not present

## 2015-01-28 NOTE — Telephone Encounter (Signed)
Patient notified of recommendations. 

## 2015-01-28 NOTE — Telephone Encounter (Signed)
Patient called asking if he needs to stay on soft foods or can he start eating normally now. Please, advise.

## 2015-01-28 NOTE — Telephone Encounter (Signed)
Should not need to stay of soft foods, but should avoid eating fast and taking large bites without liquid.  Chew food well

## 2015-02-23 ENCOUNTER — Encounter: Payer: Self-pay | Admitting: Internal Medicine

## 2015-08-20 ENCOUNTER — Encounter: Payer: Self-pay | Admitting: *Deleted

## 2015-08-24 IMAGING — RF DG ESOPHAGUS
14 of 23 series · 14 of 24 positions shown · non-contrast
Comparison: 07/28/2013

CLINICAL DATA: Globus sensation for 6 weeks.  Dysphagia.

EXAM:
ESOPHOGRAM / BARIUM SWALLOW / BARIUM TABLET STUDY
TECHNIQUE: Combined double contrast and single contrast examination performed
using effervescent crystals, thick barium liquid, and thin barium
liquid. The patient was observed with fluoroscopy swallowing a 13mm
barium sulphate tablet.
FLUOROSCOPY TIME:  1 minutes, 23 seconds.
Number of diagnostic images:  21

[Series 1: run · 1 of 10 slices shown (1 of 14)]
[im 1/10]
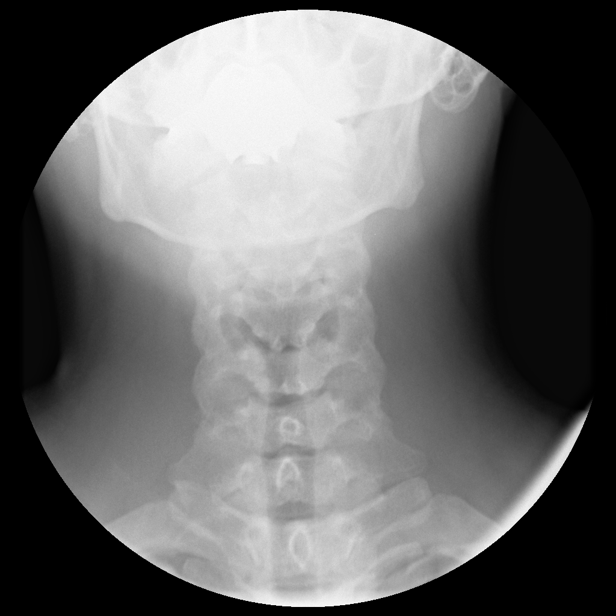

[Series 2: run · 1 of 9 slices shown (2 of 14)]
[im 1/9]
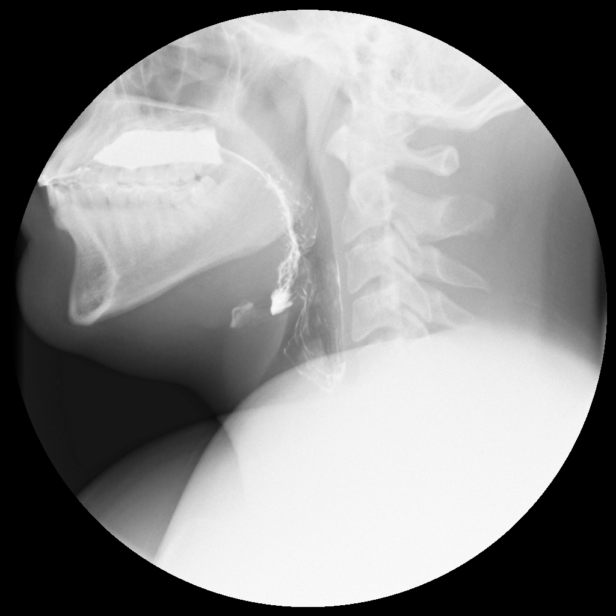

[Series 4: run · 1 of 1 slices shown (3 of 14)]
[im 1/1]
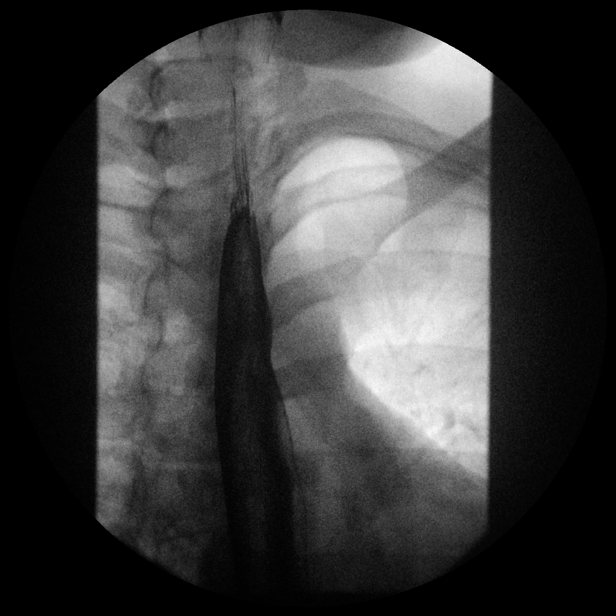

[Series 6: run · 1 of 1 slices shown (4 of 14)]
[im 1/1]
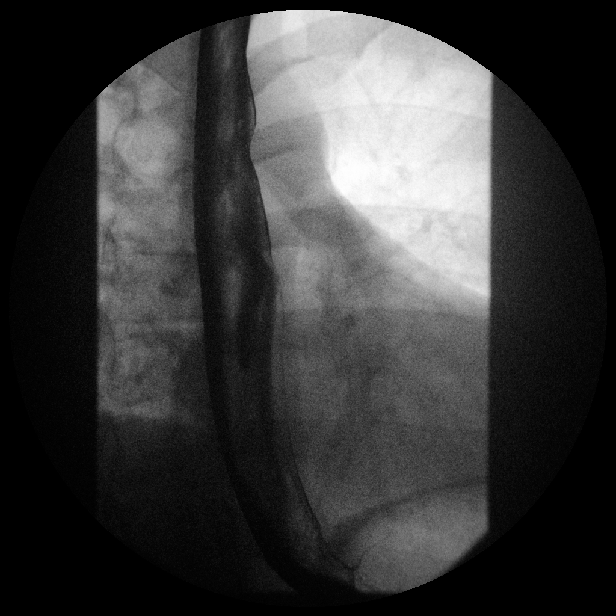

[Series 7: run · 1 of 1 slices shown (5 of 14)]
[im 1/1]
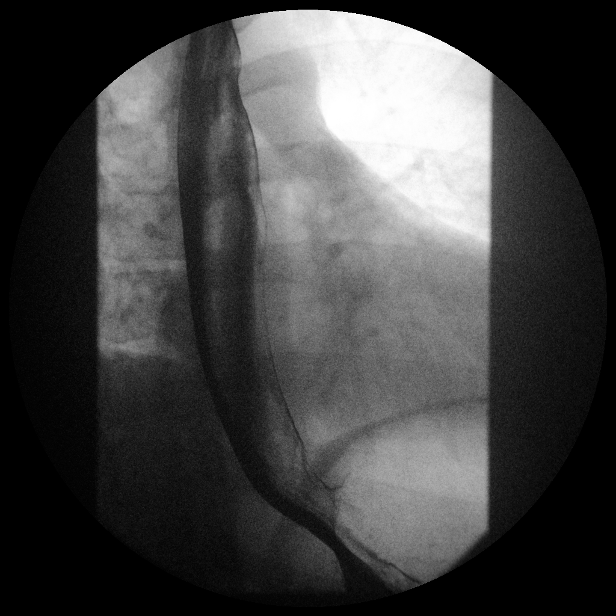

[Series 9: run · 1 of 1 slices shown (6 of 14)]
[im 1/1]
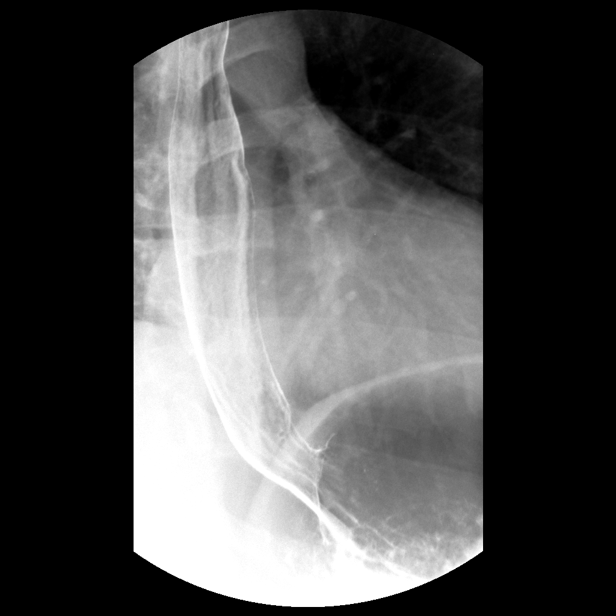

[Series 11: run · 1 of 1 slices shown (7 of 14)]
[im 1/1]
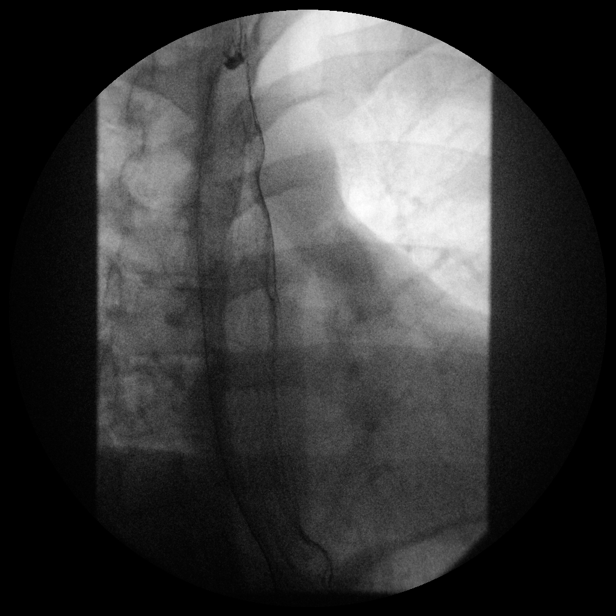

[Series 12: run · 1 of 1 slices shown (8 of 14)]
[im 1/1]
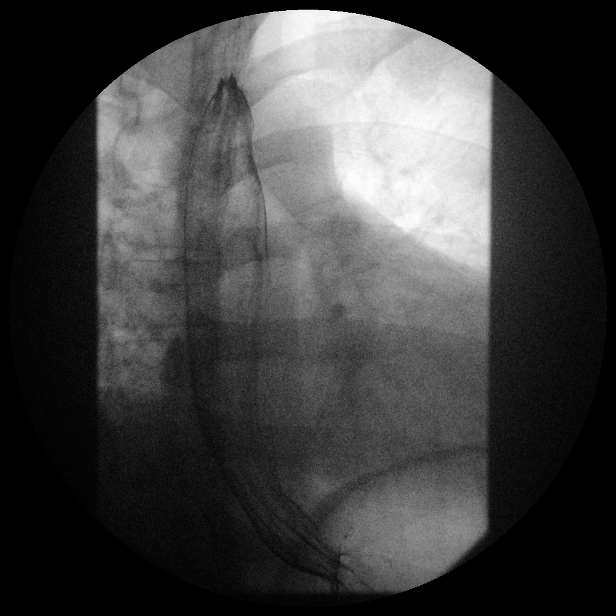

[Series 14: run · 1 of 1 slices shown (9 of 14)]
[im 1/1]
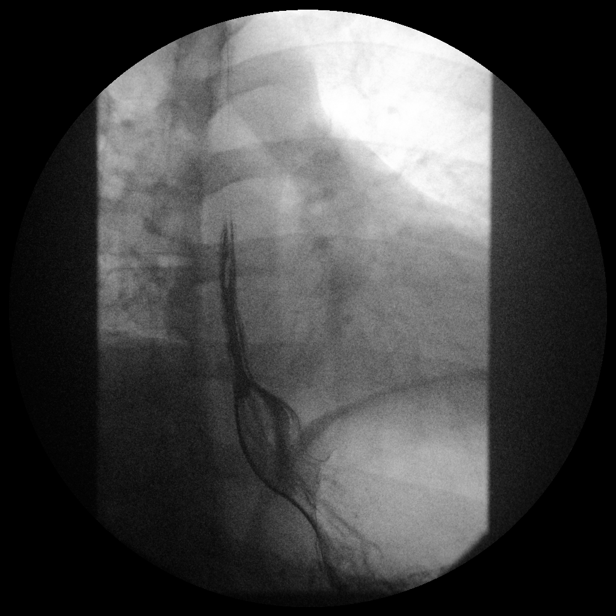

[Series 16: run · 1 of 1 slices shown (10 of 14)]
[im 1/1]
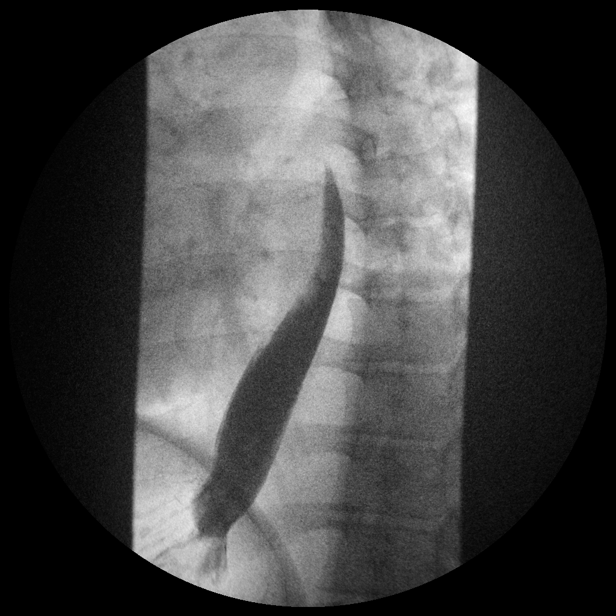

[Series 18: run · 1 of 1 slices shown (11 of 14)]
[im 1/1]
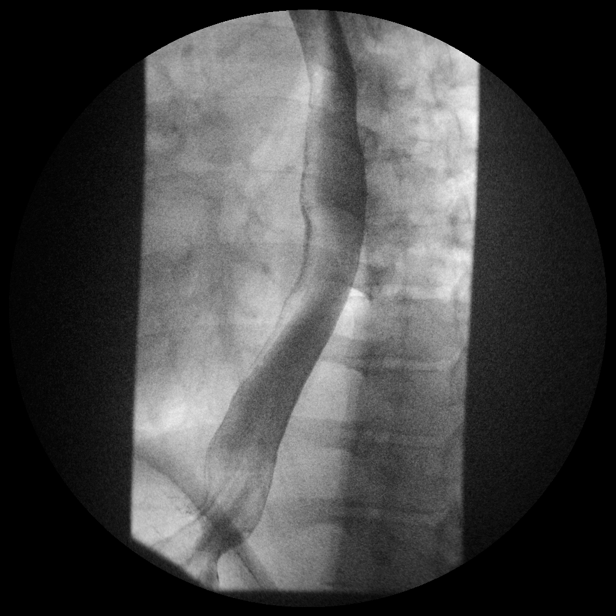

[Series 19: run · 1 of 1 slices shown (12 of 14)]
[im 1/1]
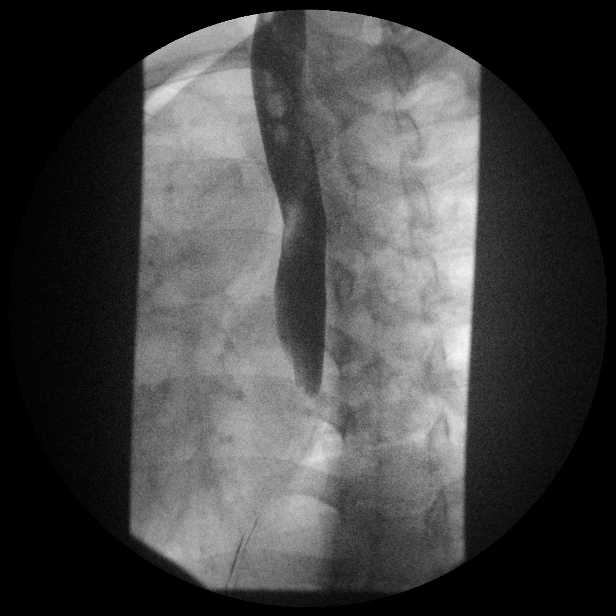

[Series 21: run · 1 of 1 slices shown (13 of 14)]
[im 1/1]
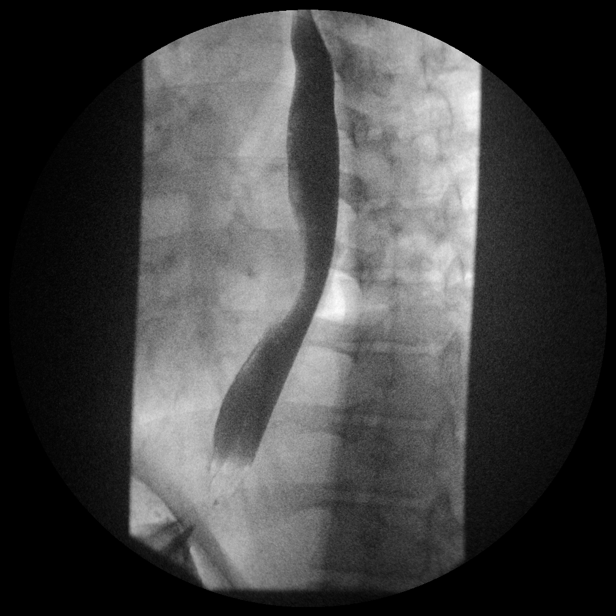

[Series 23: run · 1 of 1 slices shown (14 of 14)]
[im 1/1]
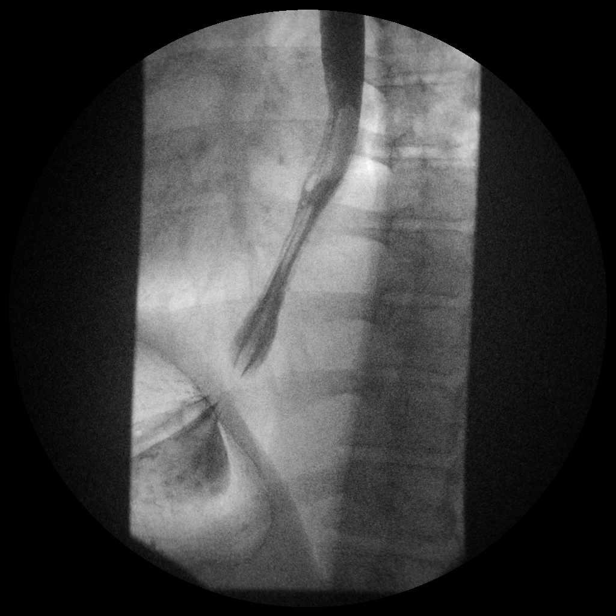

[14 of 24 positions shown; findings below may reference images not displayed]

FINDINGS: The pharyngeal phase of swallowing appears normal.

Primary peristaltic waves in the esophagus were disrupted in the mid
esophagus on [DATE] swallows.

Double contrast esophagram demonstrates normal luminal contour and
appearance of the esophagus. No ulceration or erosions. Normal
distal esophageal folds.

A 13 mm barium tablet passed without difficulty into the stomach.
IMPRESSION: 1. Mild nonspecific esophageal dysmotility disorder, with disruption
of primary peristaltic waves in the mid esophagus on [DATE]
swallows.

## 2015-09-27 ENCOUNTER — Ambulatory Visit (INDEPENDENT_AMBULATORY_CARE_PROVIDER_SITE_OTHER): Payer: No Typology Code available for payment source | Admitting: Internal Medicine

## 2015-09-27 ENCOUNTER — Encounter: Payer: Self-pay | Admitting: Internal Medicine

## 2015-09-27 VITALS — BP 136/72 | HR 64 | Ht 72.0 in | Wt 229.0 lb

## 2015-09-27 DIAGNOSIS — K760 Fatty (change of) liver, not elsewhere classified: Secondary | ICD-10-CM

## 2015-09-27 DIAGNOSIS — K224 Dyskinesia of esophagus: Secondary | ICD-10-CM | POA: Diagnosis not present

## 2015-09-27 DIAGNOSIS — K219 Gastro-esophageal reflux disease without esophagitis: Secondary | ICD-10-CM

## 2015-09-27 DIAGNOSIS — F419 Anxiety disorder, unspecified: Secondary | ICD-10-CM

## 2015-09-27 DIAGNOSIS — K589 Irritable bowel syndrome without diarrhea: Secondary | ICD-10-CM | POA: Diagnosis not present

## 2015-09-27 MED ORDER — FAMOTIDINE 20 MG PO TABS: 20.0000 mg | ORAL_TABLET | Freq: Two times a day (BID) | ORAL | Status: AC

## 2015-09-27 MED ORDER — PANTOPRAZOLE SODIUM 40 MG PO TBEC: 40.0000 mg | DELAYED_RELEASE_TABLET | Freq: Every day | ORAL | Status: DC

## 2015-09-27 NOTE — Patient Instructions (Addendum)
Please increase your zoloft to 25 mg daily as recently recommended by your primary care doctor.  We have sent the following medications to your pharmacy for you to pick up at your convenience: pantoprazole 40 mg daily pepcid 20 mg every night as needed.  Please purchase the following medications over the counter and take as directed: VSL #3-Take 2 tablets daily x 1 month,then take 1 tablet by mouth daily thereafter.  Please follow up with Dr Hilarie Fredrickson in 4-6 months.

## 2015-09-27 NOTE — Progress Notes (Signed)
Subjective:    Patient ID: Ian Rojas, male    DOB: 1990/12/26, 24 y.o.   MRN: [id number]  HPI Ian Rojas is a 24 year old male with history of GERD and dysphagia, fatty liver, anxiety who is seen in follow-up. He was last seen in February 2016 at which point his soft oh dysphagia was felt secondary to esophagitis from uncontrolled reflux. He had a barium swallow which did show some nonspecific esophageal dysmotility. PPI was recommended to be used on a regular basis and follow-up advised. Today he reports that on the whole he is doing okay he has lost weight about 40 pounds in the past year. He will reports initially this was due to severe anxiety and fear of choking with swallowing. His swallowing his improved and he has been taken pantoprazole 40 mg twice a day. His PCP increased PPI to twice a day several months ago. His swallowing symptoms have improved significantly. He has been dealing with stress and anxiety and has been started on Zoloft 12.5 mg. It was recommended he increase this to 25 mg but he's been somewhat nervous to do so. He occasionally has upper abdominal pain which he feels relates to gas. This can be in the right and left upper quadrant. It is nonspecific. Not associated with nausea or vomiting. No early satiety. Able to eat full meals. Bowel movements vary between soft to at times hard. Occurs every day to every 2-3 days. Very much relates to his dietary intake. He's been trying to follow a FODMAP diet. He bought a book regarding this diet. He has been avoiding lactose and fatty foods. He denies blood in his stool or melena. He's had some issues with hip pain and he was told by chiropractor that it was in alignment problem. He reports his primary doctor check labs recently which were reportedly normal. He reports his primary care physician feels like he has issues with anxiety and IBS.   Review of Systems As per history of present illness, otherwise negative  Current  Medications, Allergies, Past Medical History, Past Surgical History, Family History and Social History were reviewed in [employer] record.     Objective:   Physical Exam BP 136/72 mmHg  Pulse 64  Ht 6' (1.829 m)  Wt 229 lb (103.874 kg)  BMI 31.05 kg/m2 Constitutional: Well-developed and well-nourished. No distress. HEENT: Normocephalic and atraumatic. Oropharynx is clear and moist. No oropharyngeal exudate. Conjunctivae are normal.  No scleral icterus. Neck: Neck supple. Trachea midline. Cardiovascular: Normal rate, regular rhythm and intact distal pulses. No M/R/G Pulmonary/chest: Effort normal and breath sounds normal. No wheezing, rales or rhonchi. Abdominal: Soft, nontender, nondistended. Bowel sounds active throughout. There are no masses palpable. No hepatosplenomegaly. Extremities: no clubbing, cyanosis, or edema Lymphadenopathy: No cervical adenopathy noted. Neurological: Alert and oriented to person place and time. Skin: Skin is warm and dry. No rashes noted. Psychiatric: Normal mood and affect. Behavior is normal.  Labs reportedly done on 07/27/15 -- requested from [facility] PCP  CLINICAL DATA:  Globus sensation for 6 weeks.  Dysphagia.   EXAM: ESOPHOGRAM / BARIUM SWALLOW / BARIUM TABLET STUDY   TECHNIQUE: Combined double contrast and single contrast examination performed using effervescent crystals, thick barium liquid, and thin barium liquid. The patient was observed with fluoroscopy swallowing a 35m barium sulphate tablet.   FLUOROSCOPY TIME:  1 minutes, 23 seconds.   Number of diagnostic images:  21   COMPARISON:  07/28/2013   FINDINGS: The pharyngeal phase of swallowing  appears normal.   Primary peristaltic waves in the esophagus were disrupted in the mid esophagus on 2 out of 4 swallows.   Double contrast esophagram demonstrates normal luminal contour and appearance of the esophagus. No ulceration or erosions. Normal distal  esophageal folds.   A 13 mm barium tablet passed without difficulty into the stomach.   IMPRESSION: 1. Mild nonspecific esophageal dysmotility disorder, with disruption of primary peristaltic waves in the mid esophagus on 2 out of 4 swallows.     Electronically Signed   By: Van Clines M.D.   On: 01/28/2015 11:32       Assessment & Plan:   24 year old male with history of GERD and dysphagia, fatty liver, anxiety who is seen in follow-up  1. GERD -- history of also prior history of on and off PPI with inconsistent use. Reflux and dysphagia felt very much related. No history of eosinophilic esophagitis. Now that he is using PPI consistently GERD and swallowing symptoms have improved. Barium swallow reviewed today and dysmotility felt related to uncontrolled reflux at time of that study. We'll decrease PPI to once daily and continue GERD diet.  2. Fatty liver -- seen previously by ultrasound and CT scan. He reports liver enzymes are normal though would like to confirm this. Records requested from PCP. He has lost considerable weight and this would likely improve fatty liver. If liver enzymes remain elevated then I recommend repeat abdominal ultrasound.  3. Anxiety -- one of his bigger issues and I have encouraged him to increase Zoloft to 25 mg as initially directed by primary care. He may even need a higher dose such as 50 or 100 mg daily. He worries about his overall general medical condition despite reassurance. I think this is anxiety related in large part and I expect Zoloft will help if he is able to successfully increased dose. He very likely has a component of IBS see #4  4. Upper abdominal discomfort and bloating -- intermittent better with probiotic. I recommended a trial of VSL 3, 2 capsules time one month then 1 capsule daily thereafter. Likely irritable bowel after previous unremarkable workup.  Return in 6 months, sooner if necessary 25 minutes spent with patient today

## 2015-10-24 ENCOUNTER — Other Ambulatory Visit: Payer: Self-pay | Admitting: Internal Medicine

## 2015-11-17 ENCOUNTER — Other Ambulatory Visit: Payer: Self-pay | Admitting: Internal Medicine

## 2016-02-29 ENCOUNTER — Ambulatory Visit (INDEPENDENT_AMBULATORY_CARE_PROVIDER_SITE_OTHER): Payer: BLUE CROSS/BLUE SHIELD | Admitting: Internal Medicine

## 2016-02-29 ENCOUNTER — Encounter: Payer: Self-pay | Admitting: Internal Medicine

## 2016-02-29 ENCOUNTER — Other Ambulatory Visit (INDEPENDENT_AMBULATORY_CARE_PROVIDER_SITE_OTHER): Payer: BLUE CROSS/BLUE SHIELD

## 2016-02-29 VITALS — BP 110/74 | HR 80 | Ht 72.0 in | Wt 247.8 lb

## 2016-02-29 DIAGNOSIS — K219 Gastro-esophageal reflux disease without esophagitis: Secondary | ICD-10-CM | POA: Diagnosis not present

## 2016-02-29 DIAGNOSIS — K224 Dyskinesia of esophagus: Secondary | ICD-10-CM | POA: Diagnosis not present

## 2016-02-29 DIAGNOSIS — K589 Irritable bowel syndrome without diarrhea: Secondary | ICD-10-CM | POA: Diagnosis not present

## 2016-02-29 DIAGNOSIS — K760 Fatty (change of) liver, not elsewhere classified: Secondary | ICD-10-CM

## 2016-02-29 DIAGNOSIS — F419 Anxiety disorder, unspecified: Secondary | ICD-10-CM

## 2016-02-29 LAB — HEPATIC FUNCTION PANEL
ALBUMIN: 4.6 g/dL (ref 3.5–5.2)
ALK PHOS: 52 U/L (ref 39–117)
ALT: 39 U/L (ref 0–53)
AST: 27 U/L (ref 0–37)
Bilirubin, Direct: 0 mg/dL (ref 0.0–0.3)
TOTAL PROTEIN: 7.9 g/dL (ref 6.0–8.3)
Total Bilirubin: 0.3 mg/dL (ref 0.2–1.2)

## 2016-02-29 NOTE — Progress Notes (Signed)
Subjective:    Patient ID: Ian Rojas, male    DOB: 10-21-1991, 25 y.o.   MRN: [id number]  HPI Ian Rojas a 25 year old male with GERD, dysphagia, fatty liver, IBS and anxiety who is here for follow-up. He was last seen in October 2016. Since his last visit he's been consistently using PPI, Zoloft was increased after last visit and he has continued probiotic. On the whole he reports he is feeling much better. His GERD has been that are controlled and he is not having heartburn. Occasionally when anxious he feels like he has to "process" his swallowing in order to initiate a swallow. At times he does feel upper abdominal pressure and this is worse if he overeats and it feels like there is "gas in my chest". Belching seems to help this get better. Eating meats also seems to make this worse for him. He is continue use a daily probiotic which is helped with bloating significantly in his lower abdomen. He feels that his anxiety significantly helped with increasing Zoloft to 50 mg daily. Bowel habits have been regular recently with much less intermittent loose stools. He's had no blood in his stool or melena. He's had a change at work and will be moving to [place] with his wife. He does plan to return to this area every 2 months for work. He would like to continue care here with [place]. He had been able to lose weight but has gained about 18 pounds recently because he feels overall he's been feeling better and been able to eat more easily.   Review of Systems As per history of present illness, otherwise negative  Current Medications, Allergies, Past Medical History, Past Surgical History, Family History and Social History were reviewed in [employer] record.     Objective:   Physical Exam BP 110/74 mmHg  Pulse 80  Ht 6' (1.829 m)  Wt 247 lb 12.8 oz (112.401 kg)  BMI 33.60 kg/m2 Constitutional: Well-developed and well-nourished. No distress. HEENT: Normocephalic and  atraumatic.  Conjunctivae are normal.  No scleral icterus. Neck: Neck supple. Trachea midline. Cardiovascular: Normal rate, regular rhythm and intact distal pulses. No M/R/G Pulmonary/chest: Effort normal and breath sounds normal. No wheezing, rales or rhonchi. Abdominal: Soft, nontender, nondistended. Bowel sounds active throughout. There are no masses palpable. No hepatosplenomegaly. Extremities: no clubbing, cyanosis, or edema Neurological: Alert and oriented to person place and time. Skin: Skin is warm and dry.  Psychiatric: Normal mood and affect. Behavior is normal.   Hepatic Function Latest Ref Rng 02/29/2016 01/10/2013  Total Protein 6.0 - 8.3 g/dL 7.9 8.2  Albumin 3.5 - 5.2 g/dL 4.6 4.3  AST 0 - 37 U/L 27 43(H)  ALT 0 - 53 U/L 39 51  Alk Phosphatase 39 - 117 U/L 52 66  Total Bilirubin 0.2 - 1.2 mg/dL 0.3 0.2(L)  Bilirubin, Direct 0.0 - 0.3 mg/dL 0.0 -   Upper endoscopy reviewed from February 2014 -- normal esophagus. Mild erosive gastritis which was biopsied. Esophageal biopsies negative for EoE. Chronic focally active gastritis without H. pylori, metaplasia or dysplasia.  Barium esophagram in February 2016 showed mild nonspecific esophageal dysmotility with disruption of primary peristaltic wave in the midesophagus on to 4 swallows     Assessment & Plan:  25 year old male with GERD, dysphagia, fatty liver, IBS and anxiety who is here for follow-up.   1. GERD and esophageal spasm -- heartburn has been well controlled with pantoprazole. We will continue 40 mg daily. Attempt to  discontinue this medication has resulted in worsening reflux symptoms. Some of the symptoms he is describing with fullness and pressure in the chest is very likely related to esophageal spasm, as seen by the dysmotility at barium swallow last year. This is likely exacerbated by reflux and overeating. GERD diet recommended.  2. Fatty liver -- he had been successful at losing weight but has gained weight  recently. I've recommended he continue to work on diet, exercise and weight reduction. Repeat liver enzymes today were normal which is reassuring.  3. Bloating -- improved with probiotic. He will continue daily probiotic. Abdominal pain has resolved  4. Anxiety -- exacerbates #1 and 3 above. Much improved on SSRI. He plans to continue Zoloft 50 mg daily  6 a 12 month follow-up, sooner if necessary 25 minutes spent with the patient today. Greater than 50% was spent in counseling and coordination of care with the patient

## 2016-02-29 NOTE — Patient Instructions (Signed)
Continue current medications.  Please follow up with Dr Hilarie Fredrickson in 6 months.  Your physician has requested that you go to the basement for the following lab work before leaving today: Hepatic function

## 2016-03-22 ENCOUNTER — Other Ambulatory Visit: Payer: Self-pay | Admitting: Internal Medicine

## 2016-03-23 ENCOUNTER — Other Ambulatory Visit: Payer: Self-pay | Admitting: Internal Medicine

## 2022-10-18 ENCOUNTER — Other Ambulatory Visit: Payer: Self-pay | Admitting: Gastroenterology

## 2022-10-18 DIAGNOSIS — R1011 Right upper quadrant pain: Secondary | ICD-10-CM

## 2022-10-18 DIAGNOSIS — K50919 Crohn's disease, unspecified, with unspecified complications: Secondary | ICD-10-CM

## 2022-10-20 ENCOUNTER — Ambulatory Visit
Admission: RE | Admit: 2022-10-20 | Discharge: 2022-10-20 | Disposition: A | Discharge status: Home / Self Care | Payer: BC Managed Care – PPO | Source: Ambulatory Visit | Attending: Gastroenterology | Admitting: Gastroenterology

## 2022-10-20 DIAGNOSIS — K50919 Crohn's disease, unspecified, with unspecified complications: Secondary | ICD-10-CM

## 2022-10-20 DIAGNOSIS — R1011 Right upper quadrant pain: Secondary | ICD-10-CM

## 2022-10-24 ENCOUNTER — Other Ambulatory Visit: Payer: Self-pay | Admitting: Gastroenterology

## 2022-10-24 DIAGNOSIS — R1011 Right upper quadrant pain: Secondary | ICD-10-CM

## 2022-10-24 DIAGNOSIS — R932 Abnormal findings on diagnostic imaging of liver and biliary tract: Secondary | ICD-10-CM

## 2022-11-08 ENCOUNTER — Telehealth: Payer: Self-pay | Admitting: Oncology

## 2022-11-08 NOTE — Telephone Encounter (Signed)
Spoke with patient confirming new patient appointment 12/8

## 2022-11-23 ENCOUNTER — Other Ambulatory Visit: Payer: BLUE CROSS/BLUE SHIELD

## 2022-11-23 ENCOUNTER — Ambulatory Visit
Admission: RE | Admit: 2022-11-23 | Discharge: 2022-11-23 | Disposition: A | Discharge status: Home / Self Care | Payer: BC Managed Care – PPO | Source: Ambulatory Visit | Attending: Gastroenterology | Admitting: Gastroenterology

## 2022-11-23 DIAGNOSIS — R932 Abnormal findings on diagnostic imaging of liver and biliary tract: Secondary | ICD-10-CM

## 2022-11-23 DIAGNOSIS — R1011 Right upper quadrant pain: Secondary | ICD-10-CM

## 2022-11-23 MED ORDER — IOPAMIDOL (ISOVUE-300) INJECTION 61%
100.0000 mL | Freq: Once | INTRAVENOUS | Status: AC | PRN
  Administered 2022-11-23: 100 mL via INTRAVENOUS

## 2022-11-24 ENCOUNTER — Inpatient Hospital Stay: Payer: BC Managed Care – PPO | Attending: Oncology | Admitting: Oncology

## 2022-11-24 ENCOUNTER — Inpatient Hospital Stay: Payer: BC Managed Care – PPO

## 2022-11-24 ENCOUNTER — Telehealth: Payer: Self-pay | Admitting: Oncology

## 2022-11-24 VITALS — BP 128/86 | HR 74 | Temp 98.3°F | Resp 16 | Wt 230.3 lb

## 2022-11-24 DIAGNOSIS — K219 Gastro-esophageal reflux disease without esophagitis: Secondary | ICD-10-CM | POA: Insufficient documentation

## 2022-11-24 DIAGNOSIS — F5089 Other specified eating disorder: Secondary | ICD-10-CM | POA: Insufficient documentation

## 2022-11-24 DIAGNOSIS — Z79899 Other long term (current) drug therapy: Secondary | ICD-10-CM | POA: Diagnosis not present

## 2022-11-24 DIAGNOSIS — D539 Nutritional anemia, unspecified: Secondary | ICD-10-CM | POA: Insufficient documentation

## 2022-11-24 DIAGNOSIS — R5383 Other fatigue: Secondary | ICD-10-CM | POA: Insufficient documentation

## 2022-11-24 DIAGNOSIS — K6289 Other specified diseases of anus and rectum: Secondary | ICD-10-CM | POA: Insufficient documentation

## 2022-11-24 DIAGNOSIS — R0602 Shortness of breath: Secondary | ICD-10-CM | POA: Diagnosis not present

## 2022-11-24 DIAGNOSIS — K5090 Crohn's disease, unspecified, without complications: Secondary | ICD-10-CM | POA: Diagnosis not present

## 2022-11-24 DIAGNOSIS — K50919 Crohn's disease, unspecified, with unspecified complications: Secondary | ICD-10-CM

## 2022-11-24 DIAGNOSIS — K9049 Malabsorption due to intolerance, not elsewhere classified: Secondary | ICD-10-CM

## 2022-11-24 DIAGNOSIS — K909 Intestinal malabsorption, unspecified: Secondary | ICD-10-CM | POA: Insufficient documentation

## 2022-11-24 DIAGNOSIS — D509 Iron deficiency anemia, unspecified: Secondary | ICD-10-CM | POA: Insufficient documentation

## 2022-11-24 DIAGNOSIS — R002 Palpitations: Secondary | ICD-10-CM | POA: Diagnosis not present

## 2022-11-24 DIAGNOSIS — K760 Fatty (change of) liver, not elsewhere classified: Secondary | ICD-10-CM | POA: Diagnosis not present

## 2022-11-24 DIAGNOSIS — Z8719 Personal history of other diseases of the digestive system: Secondary | ICD-10-CM | POA: Insufficient documentation

## 2022-11-24 LAB — CBC WITH DIFFERENTIAL (CANCER CENTER ONLY)
Abs Immature Granulocytes: 0.05 10*3/uL (ref 0.00–0.07)
Basophils Absolute: 0.1 10*3/uL (ref 0.0–0.1)
Basophils Relative: 1 %
Eosinophils Absolute: 0.5 10*3/uL (ref 0.0–0.5)
Eosinophils Relative: 5 %
HCT: 41.8 % (ref 39.0–52.0)
Hemoglobin: 13.5 g/dL (ref 13.0–17.0)
Immature Granulocytes: 1 %
Lymphocytes Relative: 20 %
Lymphs Abs: 1.9 10*3/uL (ref 0.7–4.0)
MCH: 25.6 pg — ABNORMAL LOW (ref 26.0–34.0)
MCHC: 32.3 g/dL (ref 30.0–36.0)
MCV: 79.2 fL — ABNORMAL LOW (ref 80.0–100.0)
Monocytes Absolute: 1 10*3/uL (ref 0.1–1.0)
Monocytes Relative: 11 %
Neutro Abs: 6 10*3/uL (ref 1.7–7.7)
Neutrophils Relative %: 62 %
Platelet Count: 487 10*3/uL — ABNORMAL HIGH (ref 150–400)
RBC: 5.28 MIL/uL (ref 4.22–5.81)
RDW: 14.5 % (ref 11.5–15.5)
WBC Count: 9.5 10*3/uL (ref 4.0–10.5)
nRBC: 0 % (ref 0.0–0.2)

## 2022-11-24 LAB — FOLATE: Folate: 9.9 ng/mL (ref >5.9)

## 2022-11-24 LAB — VITAMIN B12: Vitamin B-12: 95 pg/mL — ABNORMAL LOW (ref 180–914)

## 2022-11-24 LAB — FERRITIN: Ferritin: 10 ng/mL — ABNORMAL LOW (ref 24–336)

## 2022-11-24 LAB — VITAMIN D 25 HYDROXY (VIT D DEFICIENCY, FRACTURES): Vit D, 25-Hydroxy: 13.5 ng/mL — ABNORMAL LOW (ref 30–100)

## 2022-11-24 NOTE — Progress Notes (Signed)
[facility] Initial Visit:  Patient Care Team: Ricard Dillon, MD as PCP - General (Internal Medicine)  CHIEF COMPLAINTS/PURPOSE OF CONSULTATION:  Oncology History   No history exists.    HISTORY OF PRESENTING ILLNESS: Ian Rojas 31 y.o. male is here because of anemia Medical history notable for esophageal dysmotility, GERD, gastritis, hepatic steatosis, Crohn's disease  April 2023 ferritin 14  October 18, 2022: WBC 11.2 hemoglobin 13.6 MCV 89 platelet count 443; 66 segs 19 lymphs 5 eos 1 basophil Iron saturation 8 ferritin 24 CRP 6 sed rate 4 CMP normal  October 20, 2022: Abdominal ultrasound evaluate pain.  Liver parenchyma heterogeneously coarsened echotexture and diffusely increased in echogenicity.  Hypoechoic areas in the posterior aspect of left lobe and within right lobe.  November 23, 2022: CT abdomen pelvis fatty liver.Stomach/Bowel: There is diffuse sequential thickening of a long segment of the distal and terminal ileum in the lower abdomen most consistent with Crohn's disease. There is hypervascular appearance of the mesentery (coronal 70/8) with "comb sign" suggestive of active Crohn's disease. There is long segment areas of wall thickening with luminal narrowing and several foci of slight poststenotic dilatation. There is no bowel obstruction. The appendix is normal.  November 24 2022:  [facility] Diagnosed with Crohn's disease in November 2021 but since 2016 experienced diarrheal stools up to 16 times daily, rectal pain, abdominal pain, fatigue.  He noted passage of mucus and blood. He had two years of remission but then it returned.   In 2021 underwent endoscopy at [facility] which established the diagnosis.  He is being treated with Humara but still has up to 6 bowel movements daily.  He is to be seen at [facility] for consideration of a change in therapy.    Has never received PRBC's.  Received IV iron in May 2023 administered in  [facility] in [place], [place].  This was the first time he received IV iron.  He has tried multiple iron supplements which he could not tolerate because it caused significant epigastric pain.  Likes cold beverages and has some ice pica. Fatigued, has SOB, palpitations.    Not taking J85, folic acid.    Social:  Married.  Electrical engineer.  Tobacco none.  EtOH rare  [facility] Mother alive 50 well Father alive 64 back problems Has 8 half siblings.  One has hemophilia and heart problems (different mother)  Review of Systems - Oncology  MEDICAL HISTORY: Past Medical History:  Diagnosis Date   Esophageal dysmotility    Gastritis    GERD (gastroesophageal reflux disease)    Hepatic steatosis    severe with focal faty sparing   Seasonal allergies     SURGICAL HISTORY: Past Surgical History:  Procedure Laterality Date   CIRCUMCISION      SOCIAL HISTORY: Social History   Socioeconomic History   Marital status: Single    Spouse name: Not on file   Number of children: Not on file   Years of education: Not on file   Highest education level: Not on file  Occupational History   Not on file  Tobacco Use   Smoking status: Never   Smokeless tobacco: Never  Substance and Sexual Activity   Alcohol use: Yes    Comment: OCCASIONAL-VERY RARE   Drug use: No   Sexual activity: Not on file  Other Topics Concern   Not on file  Social History Narrative   Not on file   Social Determinants of Health  Financial Resource Strain: Not on file  Food Insecurity: Not on file  Transportation Needs: Not on file  Physical Activity: Not on file  Stress: Not on file  Social Connections: Not on file  Intimate Partner Violence: Not on file    FAMILY HISTORY Family History  Problem Relation Age of Onset   Colon cancer Neg Hx    Colitis Father        AS CHILD   Irritable bowel syndrome Paternal Grandfather     ALLERGIES:  has No Known Allergies.  MEDICATIONS:  Current  Outpatient Medications  Medication Sig Dispense Refill   famotidine (PEPCID) 20 MG tablet Take 1 tablet (20 mg total) by mouth 2 (two) times daily. 30 tablet 0   pantoprazole (PROTONIX) 40 MG tablet TAKE 1 TABLET (40 MG TOTAL) BY MOUTH DAILY. 30 tablet 3   sertraline (ZOLOFT) 50 MG tablet Take 50 mg by mouth.     No current facility-administered medications for this visit.    PHYSICAL EXAMINATION:  ECOG PERFORMANCE STATUS: 1 - Symptomatic but completely ambulatory   There were no vitals filed for this visit.  There were no vitals filed for this visit.   Physical Exam Vitals and nursing note reviewed.  Constitutional:      General: He is not in acute distress.    Appearance: Normal appearance. He is normal weight. He is not ill-appearing, toxic-appearing or diaphoretic.     Comments: Here alone.    HENT:     Head: Normocephalic and atraumatic.     Right Ear: External ear normal.     Left Ear: External ear normal.     Nose: Nose normal.  Eyes:     Conjunctiva/sclera: Conjunctivae normal.     Pupils: Pupils are equal, round, and reactive to light.  Cardiovascular:     Rate and Rhythm: Normal rate and regular rhythm.     Heart sounds: Normal heart sounds.     No friction rub. No gallop.  Pulmonary:     Effort: Pulmonary effort is normal. No respiratory distress.     Breath sounds: Normal breath sounds. No stridor.  Abdominal:     General: Abdomen is flat.     Palpations: Abdomen is soft. There is no mass.     Tenderness: There is no guarding or rebound.  Musculoskeletal:        General: No swelling or tenderness. Normal range of motion.     Cervical back: Normal range of motion and neck supple. No rigidity or tenderness.     Right lower leg: No edema.     Left lower leg: No edema.  Lymphadenopathy:     Head:     Right side of head: No submental, submandibular, tonsillar, preauricular, posterior auricular or occipital adenopathy.     Left side of head: No submental,  submandibular, tonsillar, preauricular, posterior auricular or occipital adenopathy.     Cervical: No cervical adenopathy.     Right cervical: No superficial, deep or posterior cervical adenopathy.    Left cervical: No superficial, deep or posterior cervical adenopathy.     Upper Body:     Right upper body: No supraclavicular or axillary adenopathy.     Left upper body: No supraclavicular or axillary adenopathy.     Lower Body: No right inguinal adenopathy. No left inguinal adenopathy.  Skin:    Coloration: Skin is not jaundiced or pale.     Findings: No bruising or erythema.  Neurological:     General:  No focal deficit present.     Mental Status: He is alert and oriented to person, place, and time.     Cranial Nerves: No cranial nerve deficit.  Psychiatric:        Mood and Affect: Mood normal.        Behavior: Behavior normal.        Thought Content: Thought content normal.        Judgment: Judgment normal.      LABORATORY DATA: I have personally reviewed the data as listed:  No visits with results within 1 Month(s) from this visit.  Latest known visit with results is:  Appointment on 02/29/2016  Component Date Value Ref Range Status   Total Bilirubin 02/29/2016 0.3  0.2 - 1.2 mg/dL Final   Bilirubin, Direct 02/29/2016 0.0  0.0 - 0.3 mg/dL Final   Alkaline Phosphatase 02/29/2016 52  39 - 117 U/L Final   AST 02/29/2016 27  0 - 37 U/L Final   ALT 02/29/2016 39  0 - 53 U/L Final   Total Protein 02/29/2016 7.9  6.0 - 8.3 g/dL Final   Albumin 02/29/2016 4.6  3.5 - 5.2 g/dL Final    RADIOGRAPHIC STUDIES: I have personally reviewed the radiological images as listed and agree with the findings in the report  CT ABDOMEN PELVIS W CONTRAST  Result Date: 11/23/2022 CLINICAL DATA:  Right upper quadrant abdominal pain. EXAM: CT ABDOMEN AND PELVIS WITH CONTRAST TECHNIQUE: Multidetector CT imaging of the abdomen and pelvis was performed using the standard protocol following bolus  administration of intravenous contrast. RADIATION DOSE REDUCTION: This exam was performed according to the departmental dose-optimization program which includes automated exposure control, adjustment of the mA and/or kV according to patient size and/or use of iterative reconstruction technique. CONTRAST:  128m ISOVUE-300 IOPAMIDOL (ISOVUE-300) INJECTION 61% COMPARISON:  CT abdomen pelvis dated 07/28/2013. FINDINGS: Evaluation of this exam is limited due to respiratory motion artifact. Lower chest: The visualized lung bases are clear. No intra-abdominal free air or free fluid. Hepatobiliary: Fatty liver. No biliary dilatation. The gallbladder is unremarkable. Pancreas: Unremarkable. No pancreatic ductal dilatation or surrounding inflammatory changes. Spleen: Normal in size without focal abnormality. Adrenals/Urinary Tract: The adrenal glands are unremarkable. There is a punctate nonobstructing right renal inferior pole calculus. There is no hydronephrosis on either side. There is symmetric enhancement and excretion of contrast by both kidneys. Duplicated left renal collecting system and visualized proximal ureters. The visualized ureters and urinary bladder appear unremarkable. Stomach/Bowel: There is diffuse sequential thickening of a long segment of the distal and terminal ileum in the lower abdomen most consistent with Crohn's disease. There is hypervascular appearance of the mesentery (coronal 70/8) with "comb sign" suggestive of active Crohn's disease. There is long segment areas of wall thickening with luminal narrowing and several foci of slight poststenotic dilatation. There is no bowel obstruction. The appendix is normal. Vascular/Lymphatic: The abdominal aorta and IVC are unremarkable. No portal venous gas. There is no adenopathy. Reproductive: The prostate and seminal vesicles are grossly remarkable. No pelvic mass. Other: Small fat containing umbilical hernia Musculoskeletal: No acute or significant  osseous findings. IMPRESSION: 1. Findings most consistent with active Crohn's disease of the distal and terminal ileum. No bowel obstruction. Normal appendix. 2. Punctate nonobstructing right renal inferior pole calculus. No hydronephrosis. 3. Fatty liver. Electronically Signed   By: AAnner CreteM.D.   On: 11/23/2022 23:03    ASSESSMENT/PLAN  31year old male with medical history notable for esophageal dysmotility, GERD,  gastritis, hepatic steatosis, Crohn's disease.  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  Other components being impaired absorption and iron resistance due to inflammation.  Patients also face intolerance to oral iron preparations because non-absorbed iron exacerbates IBD symptoms via a variety of mechanisms.    Will check CBC with diff, CMP, Ferritin, B12, folate, Zinc, Copper vitamin D, haptoglobin.    Therapeutics:  Since patient has symptomatic iron deficiency and has not tolerated/been compliant with oral iron will arrange for IV iron replacement.   Given the severe symptoms we prefer to replete iron stores in one or two visits rather than over the course of several months.  In addition ongoing blood loss exceeds the capacity of oral iron to meet needs. A discussion regarding risks was had with the patient.  IV iron has the potential to cause allergic reactions, including potentially life-threatening anaphylaxis.   IV iron may be associated with non-allergic infusion reactions including self-limiting urticaria, palpitations, dizziness, and neck and back spasm; generally, these occur in <1 percent of individuals and do not progress to more serious reactions. The non-allergic reaction consisting of flushing of the face and myalgias of the chest and back.   After discussion of the risks and benefits of IV iron therapy patient has elected to proceed with  parenteral iron therapy.      Crohn's disease: Currently being treated with Humara.  By history and imaging does not appear to be under optimum control.  To see GI to discuss further options     Cancer Staging  No matching staging information was found for the patient.   No problem-specific Assessment & Plan notes found for this encounter.   No orders of the defined types were placed in this encounter.   All questions were answered. The patient knows to call the clinic with any problems, questions or concerns.  This note was electronically signed.    Barbee Cough, MD  11/24/2022 10:21 AM

## 2022-11-24 NOTE — Telephone Encounter (Signed)
Sched per 12/8 IB, called 2x no answer and line kept ringing

## 2022-11-24 NOTE — Patient Instructions (Signed)
Please begin Folic acid 1 mg by mouth daily Please also begin Vitamin B12 1000 micrograms under the tongue daily

## 2022-11-26 LAB — HAPTOGLOBIN: Haptoglobin: 217 mg/dL (ref 17–317)

## 2022-11-27 ENCOUNTER — Telehealth: Payer: Self-pay | Admitting: Oncology

## 2022-11-27 NOTE — Telephone Encounter (Signed)
Called patient to notify about upcoming appointments. Unable to leave patient vm

## 2022-12-01 ENCOUNTER — Inpatient Hospital Stay: Payer: BC Managed Care – PPO

## 2022-12-01 ENCOUNTER — Telehealth: Payer: Self-pay | Admitting: Oncology

## 2022-12-01 LAB — COPPER, SERUM: Copper: 76 ug/dL (ref 69–132)

## 2022-12-01 LAB — ZINC: Zinc: 77 ug/dL (ref 44–115)

## 2022-12-01 NOTE — Telephone Encounter (Signed)
Patient called to r/s infusion. He can only do early. R/s patient per IQ availability.

## 2022-12-04 ENCOUNTER — Other Ambulatory Visit: Payer: Self-pay

## 2022-12-04 ENCOUNTER — Ambulatory Visit: Payer: BC Managed Care – PPO

## 2022-12-04 ENCOUNTER — Inpatient Hospital Stay: Payer: BC Managed Care – PPO

## 2022-12-04 VITALS — BP 139/76 | HR 80 | Temp 98.6°F | Resp 17

## 2022-12-04 DIAGNOSIS — D509 Iron deficiency anemia, unspecified: Secondary | ICD-10-CM | POA: Diagnosis not present

## 2022-12-04 DIAGNOSIS — K50919 Crohn's disease, unspecified, with unspecified complications: Secondary | ICD-10-CM

## 2022-12-04 DIAGNOSIS — D539 Nutritional anemia, unspecified: Secondary | ICD-10-CM

## 2022-12-04 MED ORDER — SODIUM CHLORIDE 0.9 % IV SOLN
510.0000 mg | Freq: Once | INTRAVENOUS | Status: AC
  Administered 2022-12-04: 510 mg via INTRAVENOUS
  Filled 2022-12-04: qty 510

## 2022-12-04 MED ORDER — CYANOCOBALAMIN 1000 MCG/ML IJ SOLN
1000.0000 ug | Freq: Once | INTRAMUSCULAR | Status: AC
  Administered 2022-12-04: 1000 ug via INTRAMUSCULAR
  Filled 2022-12-04: qty 1

## 2022-12-04 MED ORDER — ACETAMINOPHEN 325 MG PO TABS
650.0000 mg | ORAL_TABLET | Freq: Once | ORAL | Status: AC
  Administered 2022-12-04: 650 mg via ORAL
  Filled 2022-12-04: qty 2

## 2022-12-04 MED ORDER — LORATADINE 10 MG PO TABS
10.0000 mg | ORAL_TABLET | Freq: Every day | ORAL | Status: DC
  Administered 2022-12-04: 10 mg via ORAL
  Filled 2022-12-04: qty 1

## 2022-12-04 MED ORDER — SODIUM CHLORIDE 0.9 % IV SOLN: Freq: Once | INTRAVENOUS | Status: AC

## 2022-12-04 NOTE — Progress Notes (Signed)
Per Dr Federico Flake, ok to treat patient as subsequent iron since he has been receiving doses in [place] prior to moving to [place].

## 2022-12-07 ENCOUNTER — Telehealth: Payer: Self-pay

## 2022-12-07 NOTE — Telephone Encounter (Addendum)
Called to relay message no one answered and unable to leave a message. Will try again later.    ----- Message from Estella Husk, LPN sent at 81/27/5170 12:46 PM EST ----- Jenny Reichmann this is the second one and his message is below.  Thank You so much for your help Jenny Reichmann.  I really appreciate it.  Joy  ----- Message ----- From: Barbee Cough, MD Sent: 11/27/2022   9:36 AM EST To: Estella Husk, LPN  Please have patient begin Vitamin D3 2000 units sub lingual daily Thanks ----- Message ----- From: Buel Ream, Lab In [place] Sent: 11/24/2022  12:37 PM EST To: Barbee Cough, MD

## 2022-12-15 ENCOUNTER — Telehealth: Payer: Self-pay

## 2022-12-15 NOTE — Telephone Encounter (Addendum)
Relayed message to patient as per Dr. Federico Flake    ----- Message from Estella Husk, LPN sent at 28/76/8115 12:46 PM EST ----- Jenny Reichmann this is the second one and his message is below.  Thank You so much for your help Jenny Reichmann.  I really appreciate it.  Joy  ----- Message ----- From: Barbee Cough, MD Sent: 11/27/2022   9:36 AM EST To: Estella Husk, LPN  Please have patient begin Vitamin D3 2000 units sub lingual daily Thanks ----- Message ----- From: Buel Ream, Lab In [place] Sent: 11/24/2022  12:37 PM EST To: Barbee Cough, MD

## 2022-12-28 ENCOUNTER — Other Ambulatory Visit: Payer: Self-pay

## 2022-12-28 DIAGNOSIS — D539 Nutritional anemia, unspecified: Secondary | ICD-10-CM

## 2022-12-28 DIAGNOSIS — K50919 Crohn's disease, unspecified, with unspecified complications: Secondary | ICD-10-CM

## 2022-12-28 DIAGNOSIS — K9049 Malabsorption due to intolerance, not elsewhere classified: Secondary | ICD-10-CM

## 2022-12-29 ENCOUNTER — Inpatient Hospital Stay: Payer: BC Managed Care – PPO

## 2022-12-29 ENCOUNTER — Other Ambulatory Visit: Payer: BC Managed Care – PPO

## 2022-12-29 ENCOUNTER — Inpatient Hospital Stay: Payer: BC Managed Care – PPO | Attending: Oncology | Admitting: Oncology

## 2022-12-29 NOTE — Progress Notes (Deleted)
[facility]  Visit:  Patient Care Team: Ricard Dillon, MD as PCP - General (Internal Medicine) Barbee Cough, MD as Attending Physician (Hematology)  CHIEF COMPLAINTS/PURPOSE OF CONSULTATION:  Oncology History   No history exists.    HISTORY OF PRESENTING ILLNESS: Ian Rojas 32 y.o. male is here because of anemia Medical history notable for esophageal dysmotility, GERD, gastritis, hepatic steatosis, Crohn's disease  April 2023 ferritin 14  October 18, 2022: WBC 11.2 hemoglobin 13.6 MCV 89 platelet count 443; 66 segs 19 lymphs 5 eos 1 basophil Iron saturation 8 ferritin 24 CRP 6 sed rate 4 CMP normal  October 20, 2022: Abdominal ultrasound evaluate pain.  Liver parenchyma heterogeneously coarsened echotexture and diffusely increased in echogenicity.  Hypoechoic areas in the posterior aspect of left lobe and within right lobe.  November 23, 2022: CT abdomen pelvis fatty liver.Stomach/Bowel: There is diffuse sequential thickening of a long segment of the distal and terminal ileum in the lower abdomen most consistent with Crohn's disease. There is hypervascular appearance of the mesentery (coronal 70/8) with "comb sign" suggestive of active Crohn's disease. There is long segment areas of wall thickening with luminal narrowing and several foci of slight poststenotic dilatation. There is no bowel obstruction. The appendix is normal.  November 24 2022:  [facility] Hematology Consult Diagnosed with Crohn's disease in November 2021 but since 2016 experienced diarrheal stools up to 16 times daily, rectal pain, abdominal pain, fatigue.  He noted passage of mucus and blood. He had two years of remission but then it returned.   In 2021 underwent endoscopy at [facility] which established the diagnosis.  He is being treated with Humara but still has up to 6 bowel movements daily.  He is to be seen at [facility] for consideration of a change in therapy.    Has never  received PRBC's.  Received IV iron in May 2023 administered in [facility] in [place], [place].  This was the first time he received IV iron.  He has tried multiple iron supplements which he could not tolerate because it caused significant epigastric pain.  Likes cold beverages and has some ice pica. Fatigued, has SOB, palpitations.    Not taking [id number], folic acid.    Social:  Married.  Electrical engineer.  Tobacco none.  EtOH rare  [facility] Mother alive 19 well Father alive 46 back problems Has 8 half siblings.  One has hemophilia and heart problems (different mother)  WBC 9.5 hemoglobin 13.5 MCV 79 count 487; 60 segs 20 lymphs 11 monos 5 eos 1 basophil. Ferritin 10 folate 9.9 Copper 76 25-hydroxy vitamin D 13.5 B12 95 zinc 77  December 04, 2022: Feraheme 510 mg.  Vitamin B12 1000 mcg  December 29, 2022:  Review of Systems - Oncology  MEDICAL HISTORY: Past Medical History:  Diagnosis Date   Esophageal dysmotility    Gastritis    GERD (gastroesophageal reflux disease)    Hepatic steatosis    severe with focal faty sparing   Seasonal allergies     SURGICAL HISTORY: Past Surgical History:  Procedure Laterality Date   CIRCUMCISION      SOCIAL HISTORY: Social History   Socioeconomic History   Marital status: Single    Spouse name: Not on file   Number of children: Not on file   Years of education: Not on file   Highest education level: Not on file  Occupational History   Not on file  Tobacco Use   Smoking  status: Never   Smokeless tobacco: Never  Substance and Sexual Activity   Alcohol use: Yes    Comment: OCCASIONAL-VERY RARE   Drug use: No   Sexual activity: Not on file  Other Topics Concern   Not on file  Social History Narrative   Not on file   Social Determinants of Health   Financial Resource Strain: Not on file  Food Insecurity: Not on file  Transportation Needs: Not on file  Physical Activity: Not on file  Stress: Not on file  Social  Connections: Not on file  Intimate Partner Violence: Not on file    FAMILY HISTORY Family History  Problem Relation Age of Onset   Colon cancer Neg Hx    Colitis Father        AS CHILD   Irritable bowel syndrome Paternal Grandfather     ALLERGIES:  has No Known Allergies.  MEDICATIONS:  Current Outpatient Medications  Medication Sig Dispense Refill   Adalimumab (HUMIRA PEN [place]) Inject 0.4 mg into the skin once a week.     famotidine (PEPCID) 20 MG tablet Take 1 tablet (20 mg total) by mouth 2 (two) times daily. 30 tablet 0   pantoprazole (PROTONIX) 40 MG tablet TAKE 1 TABLET (40 MG TOTAL) BY MOUTH DAILY. 30 tablet 3   sertraline (ZOLOFT) 50 MG tablet Take 50 mg by mouth.     No current facility-administered medications for this visit.    PHYSICAL EXAMINATION:  ECOG PERFORMANCE STATUS: 1 - Symptomatic but completely ambulatory   There were no vitals filed for this visit.  There were no vitals filed for this visit.   Physical Exam Vitals and nursing note reviewed.  Constitutional:      General: He is not in acute distress.    Appearance: Normal appearance. He is normal weight. He is not ill-appearing, toxic-appearing or diaphoretic.     Comments: Here alone.    HENT:     Head: Normocephalic and atraumatic.     Right Ear: External ear normal.     Left Ear: External ear normal.     Nose: Nose normal.  Eyes:     Conjunctiva/sclera: Conjunctivae normal.     Pupils: Pupils are equal, round, and reactive to light.  Cardiovascular:     Rate and Rhythm: Normal rate and regular rhythm.     Heart sounds: Normal heart sounds.     No friction rub. No gallop.  Pulmonary:     Effort: Pulmonary effort is normal. No respiratory distress.     Breath sounds: Normal breath sounds. No stridor.  Abdominal:     General: Abdomen is flat.     Palpations: Abdomen is soft. There is no mass.     Tenderness: There is no guarding or rebound.  Musculoskeletal:        General: No swelling  or tenderness. Normal range of motion.     Cervical back: Normal range of motion and neck supple. No rigidity or tenderness.     Right lower leg: No edema.     Left lower leg: No edema.  Lymphadenopathy:     Head:     Right side of head: No submental, submandibular, tonsillar, preauricular, posterior auricular or occipital adenopathy.     Left side of head: No submental, submandibular, tonsillar, preauricular, posterior auricular or occipital adenopathy.     Cervical: No cervical adenopathy.     Right cervical: No superficial, deep or posterior cervical adenopathy.    Left cervical: No  superficial, deep or posterior cervical adenopathy.     Upper Body:     Right upper body: No supraclavicular or axillary adenopathy.     Left upper body: No supraclavicular or axillary adenopathy.     Lower Body: No right inguinal adenopathy. No left inguinal adenopathy.  Skin:    Coloration: Skin is not jaundiced or pale.     Findings: No bruising or erythema.  Neurological:     General: No focal deficit present.     Mental Status: He is alert and oriented to person, place, and time.     Cranial Nerves: No cranial nerve deficit.  Psychiatric:        Mood and Affect: Mood normal.        Behavior: Behavior normal.        Thought Content: Thought content normal.        Judgment: Judgment normal.      LABORATORY DATA: I have personally reviewed the data as listed:  No visits with results within 1 Month(s) from this visit.  Latest known visit with results is:  Appointment on 11/24/2022  Component Date Value Ref Range Status   Haptoglobin 11/24/2022 217  17 - 317 mg/dL Final   Comment: (NOTE) Performed At: [facility] [place], [place] [id number] Rush Farmer MD [id number]    Vit D, 25-Hydroxy 11/24/2022 13.50 (L)  30 - 100 ng/mL Final   Comment: (NOTE) Vitamin D deficiency has been defined by the Institute of Medicine  and an Endocrine Society practice guideline as  a level of serum 25-OH  vitamin D less than 20 ng/mL (1,2). The Endocrine Society went on to  further define vitamin D insufficiency as a level between 21 and 29  ng/mL (2).  1. IOM (Institute of Medicine). 2010. Dietary reference intakes for  calcium and D. [place]: The [employer]. 2. Holick MF, Binkley [place], Bischoff-Ferrari HA, et al. Evaluation,  treatment, and prevention of vitamin D deficiency: an Endocrine  Society clinical practice guideline, JCEM. 2011 Jul; 96(7): 1911-30.  Performed at [facility], [place] [address], [place], [place] [address]    Copper 11/24/2022 76  69 - 132 ug/dL Final   Comment: (NOTE) This test was developed and its performance characteristics determined by [facility]. It has not been cleared or approved by the Food and Drug Administration.                                Detection Limit = 5 Performed At: [facility] [address] [place], [place] [id number] Rush Farmer MD [id number]    Ferritin 11/24/2022 10 (L)  24 - 336 ng/mL Final   Performed at [facility], [address] [place], [place] [address]   Vitamin B-12 11/24/2022 95 (L)  180 - 914 pg/mL Final   Comment: (NOTE) This assay is not validated for testing neonatal or myeloproliferative syndrome specimens for Vitamin B12 levels. Performed at [facility], [place] [address], [place], [place] [address]    Zinc 11/24/2022 77  44 - 115 ug/dL Final   Comment: (NOTE) This test was developed and its performance characteristics determined by [facility]. It has not been cleared or approved by the Food and Drug Administration.  Detection Limit = 5 Performed At: [facility] [address] [place], [place] [id number] Rush Farmer MD [id number]    Folate 11/24/2022 9.9  >5.9 ng/mL Final   Performed at [facility], [place] [address] [place], [place] [address]   WBC  Count 11/24/2022 9.5  4.0 - 10.5 K/uL Final   RBC 11/24/2022 5.28  4.22 - 5.81 MIL/uL Final   Hemoglobin 11/24/2022 13.5  13.0 - 17.0 g/dL Final   HCT 11/24/2022 41.8  39.0 - 52.0 % Final   MCV 11/24/2022 79.2 (L)  80.0 - 100.0 fL Final   MCH 11/24/2022 25.6 (L)  26.0 - 34.0 pg Final   MCHC 11/24/2022 32.3  30.0 - 36.0 g/dL Final   RDW 11/24/2022 14.5  11.5 - 15.5 % Final   Platelet Count 11/24/2022 487 (H)  150 - 400 K/uL Final   nRBC 11/24/2022 0.0  0.0 - 0.2 % Final   Neutrophils Relative % 11/24/2022 62  % Final   Neutro Abs 11/24/2022 6.0  1.7 - 7.7 K/uL Final   Lymphocytes Relative 11/24/2022 20  % Final   Lymphs Abs 11/24/2022 1.9  0.7 - 4.0 K/uL Final   Monocytes Relative 11/24/2022 11  % Final   Monocytes Absolute 11/24/2022 1.0  0.1 - 1.0 K/uL Final   Eosinophils Relative 11/24/2022 5  % Final   Eosinophils Absolute 11/24/2022 0.5  0.0 - 0.5 K/uL Final   Basophils Relative 11/24/2022 1  % Final   Basophils Absolute 11/24/2022 0.1  0.0 - 0.1 K/uL Final   Immature Granulocytes 11/24/2022 1  % Final   Abs Immature Granulocytes 11/24/2022 0.05  0.00 - 0.07 K/uL Final   Performed at [facility], [place] [address] [place], [place] [address]    RADIOGRAPHIC STUDIES: I have personally reviewed the radiological images as listed and agree with the findings in the report  No results found.  ASSESSMENT/PLAN  32 year old male with medical history notable for esophageal dysmotility, GERD, gastritis, hepatic steatosis, Crohn's disease.  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  Other components being impaired absorption and iron resistance due to inflammation.  Patients also face intolerance to oral iron preparations because non-absorbed iron exacerbates IBD symptoms via a variety of mechanisms.    Will check CBC with diff, CMP, Ferritin, B12,  folate, Zinc, Copper vitamin D, haptoglobin.    Therapeutics:  Since patient has symptomatic iron deficiency and has not tolerated/been compliant with oral iron will arrange for IV iron replacement.   Given the severe symptoms we prefer to replete iron stores in one or two visits rather than over the course of several months.  In addition ongoing blood loss exceeds the capacity of oral iron to meet needs. A discussion regarding risks was had with the patient.  IV iron has the potential to cause allergic reactions, including potentially life-threatening anaphylaxis.   IV iron may be associated with non-allergic infusion reactions including self-limiting urticaria, palpitations, dizziness, and neck and back spasm; generally, these occur in <1 percent of individuals and do not progress to more serious reactions. The non-allergic reaction consisting of flushing of the face and myalgias of the chest and back.   After discussion of the risks and benefits of IV iron therapy patient has elected to proceed with parenteral iron therapy.      Crohn's disease: Currently being treated with Humara.  By history and imaging does not appear to be under optimum control.  To see GI to discuss further options      Cancer Staging  No matching staging information was found for the patient.   No problem-specific Assessment & Plan notes found for this encounter.   No orders of the defined types were placed in this encounter.   All questions were answered. The patient knows to call the clinic with any problems, questions or concerns.  This note was electronically signed.    Barbee Cough, MD  12/29/2022 2:34 PM

## 2023-08-22 ENCOUNTER — Inpatient Hospital Stay: Payer: BC Managed Care – PPO

## 2023-08-22 ENCOUNTER — Inpatient Hospital Stay: Payer: BC Managed Care – PPO | Attending: Physician Assistant | Admitting: Physician Assistant

## 2023-08-22 VITALS — BP 121/80 | HR 85 | Temp 98.8°F | Resp 17 | Wt 231.3 lb

## 2023-08-22 DIAGNOSIS — Z79899 Other long term (current) drug therapy: Secondary | ICD-10-CM | POA: Diagnosis not present

## 2023-08-22 DIAGNOSIS — R5383 Other fatigue: Secondary | ICD-10-CM | POA: Diagnosis not present

## 2023-08-22 DIAGNOSIS — Z8379 Family history of other diseases of the digestive system: Secondary | ICD-10-CM | POA: Insufficient documentation

## 2023-08-22 DIAGNOSIS — R0602 Shortness of breath: Secondary | ICD-10-CM | POA: Diagnosis not present

## 2023-08-22 DIAGNOSIS — D75839 Thrombocytosis, unspecified: Secondary | ICD-10-CM | POA: Diagnosis not present

## 2023-08-22 DIAGNOSIS — K760 Fatty (change of) liver, not elsewhere classified: Secondary | ICD-10-CM | POA: Insufficient documentation

## 2023-08-22 DIAGNOSIS — D509 Iron deficiency anemia, unspecified: Secondary | ICD-10-CM | POA: Insufficient documentation

## 2023-08-22 DIAGNOSIS — E538 Deficiency of other specified B group vitamins: Secondary | ICD-10-CM | POA: Insufficient documentation

## 2023-08-22 DIAGNOSIS — D519 Vitamin B12 deficiency anemia, unspecified: Secondary | ICD-10-CM | POA: Diagnosis not present

## 2023-08-22 DIAGNOSIS — K50919 Crohn's disease, unspecified, with unspecified complications: Secondary | ICD-10-CM

## 2023-08-22 DIAGNOSIS — R002 Palpitations: Secondary | ICD-10-CM | POA: Insufficient documentation

## 2023-08-22 DIAGNOSIS — K50918 Crohn's disease, unspecified, with other complication: Secondary | ICD-10-CM | POA: Insufficient documentation

## 2023-08-22 DIAGNOSIS — Z8719 Personal history of other diseases of the digestive system: Secondary | ICD-10-CM | POA: Insufficient documentation

## 2023-08-22 DIAGNOSIS — D508 Other iron deficiency anemias: Secondary | ICD-10-CM | POA: Diagnosis not present

## 2023-08-22 DIAGNOSIS — K219 Gastro-esophageal reflux disease without esophagitis: Secondary | ICD-10-CM | POA: Insufficient documentation

## 2023-08-22 DIAGNOSIS — K909 Intestinal malabsorption, unspecified: Secondary | ICD-10-CM | POA: Diagnosis not present

## 2023-08-22 LAB — CBC WITH DIFFERENTIAL (CANCER CENTER ONLY)
Abs Immature Granulocytes: 0.12 10*3/uL — ABNORMAL HIGH (ref 0.00–0.07)
Basophils Absolute: 0.1 10*3/uL (ref 0.0–0.1)
Basophils Relative: 1 %
Eosinophils Absolute: 0.5 10*3/uL (ref 0.0–0.5)
Eosinophils Relative: 5 %
HCT: 37.7 % — ABNORMAL LOW (ref 39.0–52.0)
Hemoglobin: 11.7 g/dL — ABNORMAL LOW (ref 13.0–17.0)
Immature Granulocytes: 1 %
Lymphocytes Relative: 23 %
Lymphs Abs: 2.2 10*3/uL (ref 0.7–4.0)
MCH: 21.4 pg — ABNORMAL LOW (ref 26.0–34.0)
MCHC: 31 g/dL (ref 30.0–36.0)
MCV: 69 fL — ABNORMAL LOW (ref 80.0–100.0)
Monocytes Absolute: 1.2 10*3/uL — ABNORMAL HIGH (ref 0.1–1.0)
Monocytes Relative: 12 %
Neutro Abs: 5.7 10*3/uL (ref 1.7–7.7)
Neutrophils Relative %: 58 %
Platelet Count: 535 10*3/uL — ABNORMAL HIGH (ref 150–400)
RBC: 5.46 MIL/uL (ref 4.22–5.81)
RDW: 16.3 % — ABNORMAL HIGH (ref 11.5–15.5)
WBC Count: 9.8 10*3/uL (ref 4.0–10.5)
nRBC: 0 % (ref 0.0–0.2)

## 2023-08-22 LAB — VITAMIN B12: Vitamin B-12: 89 pg/mL — ABNORMAL LOW (ref 180–914)

## 2023-08-22 NOTE — Progress Notes (Signed)
[facility] [phone]   [phone]  PROGRESS NOTE  Patient Care Team: Stacie Glaze, MD as PCP - General (Internal Medicine) Loni Muse, MD as Attending Physician (Hematology)   CHIEF COMPLAINTS/PURPOSE OF CONSULTATION:  Iron deficiency and vitamin B12 deficiency anemia 2/2 Crohn's disease  TREATMENT: --12/04/2022: Received IV feraheme 510 mg x 1 dose and Vitamin B12 injection x 1 dose  HISTORY OF PRESENTING ILLNESS:  Ian Rojas 32 y.o. male returns for a follow up for iron and B12 deficiency anemia. He was last seen by Dr. Angelene Giovanni on 11/24/2022 to establish care. In the interim, he receive one dose of IV feraheme and B12 IM injection. He is unaccompanied for this visit.    On exam today, Mr. Hokenson reports having worsening fatigue and increased sleepiness. He has noticed more shortness of breath with light exertion. He adds that he has palpitations when he bends over or does light exertion. He is having lightheadedness without syncopal episodes. He is planning to change his Crohn's medication from Humira to Remicade as he has a flare up at this time. He denies fevers, chills, sweats, chest pain or cough. He has no other complaints. Rest of the ROS is below.   MEDICAL HISTORY:  Past Medical History:  Diagnosis Date   Esophageal dysmotility    Gastritis    GERD (gastroesophageal reflux disease)    Hepatic steatosis    severe with focal faty sparing   Seasonal allergies     SURGICAL HISTORY: Past Surgical History:  Procedure Laterality Date   CIRCUMCISION      SOCIAL HISTORY: Social History   Socioeconomic History   Marital status: Single    Spouse name: Not on file   Number of children: Not on file   Years of education: Not on file   Highest education level: Not on file  Occupational History   Not on file  Tobacco Use   Smoking status: Never   Smokeless tobacco: Never  Substance and Sexual Activity    Alcohol use: Yes    Comment: OCCASIONAL-VERY RARE   Drug use: No   Sexual activity: Not on file  Other Topics Concern   Not on file  Social History Narrative   Not on file   Social Determinants of Health   Financial Resource Strain: Low Risk  (10/17/2022)   [facility] prior to 02/17/2023., [facility] visits prior to 02/17/2023[facility]   Overall Financial Resource Strain (CARDIA)    Difficulty of Paying Living Expenses: Not very hard  Food Insecurity: No Food Insecurity (10/17/2022)   [facility] prior to 02/17/2023., [facility] visits prior to [facility]/17/2023., [facility]   Hunger Vital Sign    Worried About Running Out of Food in the Last Year: Never true    Ran Out of Food in the Last Year: Never true  Transportation Needs: No Transportation Needs (10/17/2022)   [facility] prior to 02/17/2023., [facility] visits prior to 02/17/2023., [facility]   PRAPARE - Transportation    Lack of Transportation (Medical): No    Lack of Transportation (Non-Medical): No  Physical Activity: Inactive (10/17/2022)   [facility] visits prior to 02/17/2023., [facility] visits prior to 02/17/2023., [facility]   Exercise Vital Sign    Days of  Exercise per Week: 0 days    Minutes of Exercise per Session: 0 min  Stress: Stress Concern Present (10/17/2022)   [facility] prior to 02/17/2023., [facility] visits prior to 02/17/2023., [facility]   [employer] of Occupational Health - Occupational Stress Questionnaire    Feeling of Stress : To some extent  Social Connections: Moderately Integrated (10/17/2022)   [facility] visits prior to 02/17/2023.,  [facility] visits prior to 02/17/2023., [facility]   Social Connection and Isolation Panel [NHANES]    Frequency of Communication with Friends and Family: More than three times a week    Frequency of Social Gatherings with Friends and Family: More than three times a week    Attends Religious Services: Never    Database administrator or Organizations: Yes    Attends Banker Meetings: Never    Marital Status: Married  Catering manager Violence: Not At Risk (10/17/2022)   [facility] prior to 02/17/2023., [facility] visits prior to 02/17/2023.   Humiliation, Afraid, Rape, and Kick questionnaire    Fear of Current or Ex-Partner: No    Emotionally Abused: No    Physically Abused: No    Sexually Abused: No    FAMILY HISTORY: Family History  Problem Relation Age of Onset   Colon cancer Neg Hx    Colitis Father        AS CHILD   Irritable bowel syndrome Paternal Grandfather     ALLERGIES:  has No Known Allergies.  MEDICATIONS:  Current Outpatient Medications  Medication Sig Dispense Refill   Adalimumab (HUMIRA PEN [place]) Inject 0.4 mg into the skin once a week.     famotidine (PEPCID) 20 MG tablet Take 1 tablet (20 mg total) by mouth 2 (two) times daily. (Patient taking differently: Take 20 mg by mouth 2 (two) times daily as needed.) 30 tablet 0   pantoprazole (PROTONIX) 40 MG tablet TAKE 1 TABLET (40 MG TOTAL) BY MOUTH DAILY. 30 tablet 3   No current facility-administered medications for this visit.    REVIEW OF SYSTEMS:   Constitutional: ( - ) fevers, ( - )  chills , ( - ) night sweats Eyes: ( - ) blurriness of vision, ( - ) double vision, ( - ) watery eyes Ears, nose, mouth, throat, and face: ( - ) mucositis, ( - ) sore throat Respiratory: ( - ) cough, ( +) dyspnea, ( - ) wheezes Cardiovascular: ( - ) palpitation, ( - ) chest discomfort, ( - ) lower extremity  swelling Gastrointestinal:  ( - ) nausea, ( - ) heartburn, ( +) change in bowel habits Skin: ( - ) abnormal skin rashes Lymphatics: ( - ) new lymphadenopathy, ( - ) easy bruising Neurological: ( - ) numbness, ( - ) tingling, ( - ) new weaknesses Behavioral/Psych: ( - ) mood change, ( - ) new changes  All other systems were reviewed with the patient and are negative.  PHYSICAL EXAMINATION: ECOG PERFORMANCE STATUS: 1 - Symptomatic but completely ambulatory  Vitals:   08/22/23 1011  BP: 121/80  Pulse: 85  Resp: 17  Temp: 98.8 F (37.1 C)  SpO2: 100%   Filed Weights   08/22/23 1011  Weight: 231 lb 4.8 oz (104.9 kg)    GENERAL: well appearing male in NAD  SKIN: skin color, texture, turgor  are normal, no rashes or significant lesions EYES: conjunctiva are pink and non-injected, sclera clear LUNGS: clear to auscultation and percussion with normal breathing effort HEART: regular rate & rhythm and no murmurs and no lower extremity edema Musculoskeletal: no cyanosis of digits and no clubbing  PSYCH: alert & oriented x 3, fluent speech NEURO: no focal motor/sensory deficits  LABORATORY DATA:  I have reviewed the data as listed    Latest Ref Rng & Units 08/22/2023   10:34 AM 11/24/2022   12:18 PM 01/10/2013   11:04 PM  CBC  WBC 4.0 - 10.5 K/uL 9.8  9.5  12.5   Hemoglobin 13.0 - 17.0 g/dL 16.1  09.6  04.5   Hematocrit 39.0 - 52.0 % 37.7  41.8  44.9   Platelets 150 - 400 K/uL 535  487  305        Latest Ref Rng & Units 02/29/2016    2:56 PM 01/10/2013   11:04 PM  CMP  Glucose 70 - 99 mg/dL  409   BUN 6 - 23 mg/dL  11   Creatinine 8.11 - 1.35 mg/dL  9.14   Sodium 782 - 956 mEq/L  133   Potassium 3.5 - 5.1 mEq/L  3.9   Chloride 96 - 112 mEq/L  98   CO2 19 - 32 mEq/L  23   Calcium 8.4 - 10.5 mg/dL  9.4   Total Protein 6.0 - 8.3 g/dL 7.9  8.2   Total Bilirubin 0.2 - 1.2 mg/dL 0.3  0.2   Alkaline Phos 39 - 117 U/L 52  66   AST 0 - 37 U/L 27  43   ALT 0 - 53 U/L 39  51      ASSESSMENT & PLAN Jorrell Don is a 32 y.o. male who presents for a follow up for iron and B12 deficiency anemia 2/2 Crohn's disease.  Patient will transfer care from Dr. Angelene Giovanni to Dr. Leonides Schanz.   #Iron deficiency anemia #Vitamin B12 deficiency --Secondary to blood loss and malabsorption from Crohn's disease. Under the care of Dr. Ewing Schlein at [facility]. Currently on Humira but planning to switch to Remicade.  --Last received IV feraheme 510 mg x 1 dose and IM Vitamin B12 injection 1000 mg x 1 dose on 12/04/2022.  --Reviewed outside labs from 08/16/2023 that showed Hgb 11.4, MCV 68.8, ferritin 5.5, TIBC 460, saturation 5%.  --Labs today show persistent anemia with thrombocytosis, Hgb 11.7, MCV 69.0, Plt 535. Vitamin B12 is 89. --Patient is unable tolerate iron pills and has tried liquid iron recently. --Will plan to arrange for IV feraheme 510 mg x 2 doses. Will plan to arrange for weekly  B12 injections x 4 doses and then transition to once a month injections.  --RTC in 3 months for lab check and 6 months for labs/follow up  Orders Placed This Encounter  Procedures   CBC with Differential (Cancer Center Only)    Standing Status:   Future    Number of Occurrences:   1    Standing Expiration Date:   08/21/2024   Vitamin B12    Standing Status:   Future    Number of Occurrences:   1    Standing Expiration Date:   08/21/2024   CBC with Differential (Cancer Center Only)    Standing Status:   Standing    Number of Occurrences:   2    Standing Expiration Date:   08/21/2024   Iron and Iron Binding Capacity ([facility] only)  Standing Status:   Standing    Number of Occurrences:   2    Standing Expiration Date:   08/21/2024   Ferritin    Standing Status:   Standing    Number of Occurrences:   2    Standing Expiration Date:   08/21/2024   Vitamin B12    Standing Status:   Standing    Number of Occurrences:   2    Standing Expiration Date:   08/21/2024    All questions were answered. The  patient knows to call the clinic with any problems, questions or concerns.  I have spent a total of 30 minutes minutes of face-to-face and non-face-to-face time, preparing to see the patient, performing a medically appropriate examination, counseling and educating the patient, ordering medications/tests/procedures, documenting clinical information in the electronic health record, independently interpreting results and communicating results to the patient, and care coordination.   Georga Kaufmann, PA-C Department of Hematology/Oncology [facility] Phone: [phone]

## 2023-08-27 ENCOUNTER — Inpatient Hospital Stay: Payer: BC Managed Care – PPO

## 2023-08-27 VITALS — BP 131/86 | HR 99 | Resp 18

## 2023-08-27 DIAGNOSIS — D509 Iron deficiency anemia, unspecified: Secondary | ICD-10-CM | POA: Diagnosis not present

## 2023-08-27 DIAGNOSIS — D539 Nutritional anemia, unspecified: Secondary | ICD-10-CM

## 2023-08-27 DIAGNOSIS — K50919 Crohn's disease, unspecified, with unspecified complications: Secondary | ICD-10-CM

## 2023-08-27 MED ORDER — SODIUM CHLORIDE 0.9 % IV SOLN
510.0000 mg | Freq: Once | INTRAVENOUS | Status: AC
  Administered 2023-08-27: 510 mg via INTRAVENOUS
  Filled 2023-08-27: qty 510

## 2023-08-27 MED ORDER — SODIUM CHLORIDE 0.9 % IV SOLN: Freq: Once | INTRAVENOUS | Status: AC

## 2023-08-27 MED ORDER — CYANOCOBALAMIN 1000 MCG/ML IJ SOLN
1000.0000 ug | Freq: Once | INTRAMUSCULAR | Status: AC
  Administered 2023-08-27: 1000 ug via INTRAMUSCULAR
  Filled 2023-08-27: qty 1

## 2023-08-27 NOTE — Progress Notes (Signed)
 Pt observed for 30 minutes post Venofer infusion. Pt tolerated Tx well w/out incident. VSS at discharge.  Ambulatory to lobby.

## 2023-08-27 NOTE — Patient Instructions (Addendum)
Ferumoxytol Injection What is this medication? FERUMOXYTOL (FER ue MOX i tol) treats low levels of iron in your body (iron deficiency anemia). Iron is a mineral that plays an important role in making red blood cells, which carry oxygen from your lungs to the rest of your body. This medicine may be used for other purposes; ask your health care provider or pharmacist if you have questions. COMMON BRAND NAME(S): Feraheme What should I tell my care team before I take this medication? They need to know if you have any of these conditions: Anemia not caused by low iron levels High levels of iron in the blood Magnetic resonance imaging (MRI) test scheduled An unusual or allergic reaction to iron, other medications, foods, dyes, or preservatives Pregnant or trying to get pregnant Breastfeeding How should I use this medication? This medication is injected into a vein. It is given by your care team in a hospital or clinic setting. Talk to your care team the use of this medication in children. Special care may be needed. Overdosage: If you think you have taken too much of this medicine contact a poison control center or emergency room at once. NOTE: This medicine is only for you. Do not share this medicine with others. What if I miss a dose? It is important not to miss your dose. Call your care team if you are unable to keep an appointment. What may interact with this medication? Other iron products This list may not describe all possible interactions. Give your health care provider a list of all the medicines, herbs, non-prescription drugs, or dietary supplements you use. Also tell them if you smoke, drink alcohol, or use illegal drugs. Some items may interact with your medicine. What should I watch for while using this medication? Visit your care team regularly. Tell your care team if your symptoms do not start to get better or if they get worse. You may need blood work done while you are taking this  medication. You may need to follow a special diet. Talk to your care team. Foods that contain iron include: whole grains/cereals, dried fruits, beans, or peas, leafy green vegetables, and organ meats (liver, kidney). What side effects may I notice from receiving this medication? Side effects that you should report to your care team as soon as possible: Allergic reactions--skin rash, itching, hives, swelling of the face, lips, tongue, or throat Low blood pressure--dizziness, feeling faint or lightheaded, blurry vision Shortness of breath Side effects that usually do not require medical attention (report to your care team if they continue or are bothersome): Flushing Headache Joint pain Muscle pain Nausea Pain, redness, or irritation at injection site This list may not describe all possible side effects. Call your doctor for medical advice about side effects. You may report side effects to FDA at 1-800-FDA-1088. Where should I keep my medication? This medication is given in a hospital or clinic. It will not be stored at home. NOTE: This sheet is a summary. It may not cover all possible information. If you have questions about this medicine, talk to your doctor, pharmacist, or health care provider.  2024 Elsevier/Gold Standard (2023-05-11 00:00:00) Iron Sucrose Injection What is this medication? IRON SUCROSE (EYE ern SOO krose) treats low levels of iron (iron deficiency anemia) in people with kidney disease. Iron is a mineral that plays an important role in making red blood cells, which carry oxygen from your lungs to the rest of your body. This medicine may be used for other purposes;  ask your health care provider or pharmacist if you have questions. COMMON BRAND NAME(S): Venofer What should I tell my care team before I take this medication? They need to know if you have any of these conditions: Anemia not caused by low iron levels Heart disease High levels of iron in the blood Kidney  disease Liver disease An unusual or allergic reaction to iron, other medications, foods, dyes, or preservatives Pregnant or trying to get pregnant Breastfeeding How should I use this medication? This medication is for infusion into a vein. It is given in a hospital or clinic setting. Talk to your care team about the use of this medication in children. While this medication may be prescribed for children as young as 2 years for selected conditions, precautions do apply. Overdosage: If you think you have taken too much of this medicine contact a poison control center or emergency room at once. NOTE: This medicine is only for you. Do not share this medicine with others. What if I miss a dose? Keep appointments for follow-up doses. It is important not to miss your dose. Call your care team if you are unable to keep an appointment. What may interact with this medication? Do not take this medication with any of the following: Deferoxamine Dimercaprol Other iron products This medication may also interact with the following: Chloramphenicol Deferasirox This list may not describe all possible interactions. Give your health care provider a list of all the medicines, herbs, non-prescription drugs, or dietary supplements you use. Also tell them if you smoke, drink alcohol, or use illegal drugs. Some items may interact with your medicine. What should I watch for while using this medication? Visit your care team regularly. Tell your care team if your symptoms do not start to get better or if they get worse. You may need blood work done while you are taking this medication. You may need to follow a special diet. Talk to your care team. Foods that contain iron include: whole grains/cereals, dried fruits, beans, or peas, leafy green vegetables, and organ meats (liver, kidney). What side effects may I notice from receiving this medication? Side effects that you should report to your care team as soon as  possible: Allergic reactions--skin rash, itching, hives, swelling of the face, lips, tongue, or throat Low blood pressure--dizziness, feeling faint or lightheaded, blurry vision Shortness of breath Side effects that usually do not require medical attention (report to your care team if they continue or are bothersome): Flushing Headache Joint pain Muscle pain Nausea Pain, redness, or irritation at injection site This list may not describe all possible side effects. Call your doctor for medical advice about side effects. You may report side effects to FDA at 1-800-FDA-1088. Where should I keep my medication? This medication is given in a hospital or clinic. It will not be stored at home. NOTE: This sheet is a summary. It may not cover all possible information. If you have questions about this medicine, talk to your doctor, pharmacist, or health care provider.  2024 Elsevier/Gold Standard (2023-05-11 00:00:00)

## 2023-09-03 ENCOUNTER — Inpatient Hospital Stay: Payer: BC Managed Care – PPO

## 2023-09-03 ENCOUNTER — Ambulatory Visit: Payer: BC Managed Care – PPO

## 2023-09-08 ENCOUNTER — Other Ambulatory Visit: Payer: Self-pay

## 2023-09-08 ENCOUNTER — Inpatient Hospital Stay: Payer: BC Managed Care – PPO

## 2023-09-08 VITALS — BP 130/71 | HR 82 | Temp 98.9°F | Resp 16

## 2023-09-08 DIAGNOSIS — D539 Nutritional anemia, unspecified: Secondary | ICD-10-CM

## 2023-09-08 DIAGNOSIS — K50919 Crohn's disease, unspecified, with unspecified complications: Secondary | ICD-10-CM

## 2023-09-08 DIAGNOSIS — D509 Iron deficiency anemia, unspecified: Secondary | ICD-10-CM | POA: Diagnosis not present

## 2023-09-08 MED ORDER — SODIUM CHLORIDE 0.9 % IV SOLN: Freq: Once | INTRAVENOUS | Status: AC

## 2023-09-08 MED ORDER — SODIUM CHLORIDE 0.9 % IV SOLN
510.0000 mg | Freq: Once | INTRAVENOUS | Status: AC
  Administered 2023-09-08: 510 mg via INTRAVENOUS
  Filled 2023-09-08: qty 510

## 2023-09-08 MED ORDER — CYANOCOBALAMIN 1000 MCG/ML IJ SOLN
1000.0000 ug | Freq: Once | INTRAMUSCULAR | Status: AC
  Administered 2023-09-08: 1000 ug via INTRAMUSCULAR
  Filled 2023-09-08: qty 1

## 2023-09-08 NOTE — Progress Notes (Signed)
Patient has had quite a bit of iron with no side effects- declined 30 minute observation to get to work. VSS_  BP 130/71 (BP Location: Right Arm, Patient Position: Sitting)   Pulse 82   Temp 98.9 F (37.2 C) (Oral)   Resp 16   SpO2 97%    Ambulatory to the lobby.

## 2023-09-08 NOTE — Patient Instructions (Signed)
Ferumoxytol Injection What is this medication? FERUMOXYTOL (FER ue MOX i tol) treats low levels of iron in your body (iron deficiency anemia). Iron is a mineral that plays an important role in making red blood cells, which carry oxygen from your lungs to the rest of your body. This medicine may be used for other purposes; ask your health care provider or pharmacist if you have questions. COMMON BRAND NAME(S): Feraheme What should I tell my care team before I take this medication? They need to know if you have any of these conditions: Anemia not caused by low iron levels High levels of iron in the blood Magnetic resonance imaging (MRI) test scheduled An unusual or allergic reaction to iron, other medications, foods, dyes, or preservatives Pregnant or trying to get pregnant Breastfeeding How should I use this medication? This medication is injected into a vein. It is given by your care team in a hospital or clinic setting. Talk to your care team the use of this medication in children. Special care may be needed. Overdosage: If you think you have taken too much of this medicine contact a poison control center or emergency room at once. NOTE: This medicine is only for you. Do not share this medicine with others. What if I miss a dose? It is important not to miss your dose. Call your care team if you are unable to keep an appointment. What may interact with this medication? Other iron products This list may not describe all possible interactions. Give your health care provider a list of all the medicines, herbs, non-prescription drugs, or dietary supplements you use. Also tell them if you smoke, drink alcohol, or use illegal drugs. Some items may interact with your medicine. What should I watch for while using this medication? Visit your care team regularly. Tell your care team if your symptoms do not start to get better or if they get worse. You may need blood work done while you are taking this  medication. You may need to follow a special diet. Talk to your care team. Foods that contain iron include: whole grains/cereals, dried fruits, beans, or peas, leafy green vegetables, and organ meats (liver, kidney). What side effects may I notice from receiving this medication? Side effects that you should report to your care team as soon as possible: Allergic reactions--skin rash, itching, hives, swelling of the face, lips, tongue, or throat Low blood pressure--dizziness, feeling faint or lightheaded, blurry vision Shortness of breath Side effects that usually do not require medical attention (report to your care team if they continue or are bothersome): Flushing Headache Joint pain Muscle pain Nausea Pain, redness, or irritation at injection site This list may not describe all possible side effects. Call your doctor for medical advice about side effects. You may report side effects to FDA at 1-800-FDA-1088. Where should I keep my medication? This medication is given in a hospital or clinic. It will not be stored at home. NOTE: This sheet is a summary. It may not cover all possible information. If you have questions about this medicine, talk to your doctor, pharmacist, or health care provider.  2024 Elsevier/Gold Standard (2023-05-11 00:00:00) Iron Sucrose Injection What is this medication? IRON SUCROSE (EYE ern SOO krose) treats low levels of iron (iron deficiency anemia) in people with kidney disease. Iron is a mineral that plays an important role in making red blood cells, which carry oxygen from your lungs to the rest of your body. This medicine may be used for other purposes;  ask your health care provider or pharmacist if you have questions. COMMON BRAND NAME(S): Venofer What should I tell my care team before I take this medication? They need to know if you have any of these conditions: Anemia not caused by low iron levels Heart disease High levels of iron in the blood Kidney  disease Liver disease An unusual or allergic reaction to iron, other medications, foods, dyes, or preservatives Pregnant or trying to get pregnant Breastfeeding How should I use this medication? This medication is for infusion into a vein. It is given in a hospital or clinic setting. Talk to your care team about the use of this medication in children. While this medication may be prescribed for children as young as 2 years for selected conditions, precautions do apply. Overdosage: If you think you have taken too much of this medicine contact a poison control center or emergency room at once. NOTE: This medicine is only for you. Do not share this medicine with others. What if I miss a dose? Keep appointments for follow-up doses. It is important not to miss your dose. Call your care team if you are unable to keep an appointment. What may interact with this medication? Do not take this medication with any of the following: Deferoxamine Dimercaprol Other iron products This medication may also interact with the following: Chloramphenicol Deferasirox This list may not describe all possible interactions. Give your health care provider a list of all the medicines, herbs, non-prescription drugs, or dietary supplements you use. Also tell them if you smoke, drink alcohol, or use illegal drugs. Some items may interact with your medicine. What should I watch for while using this medication? Visit your care team regularly. Tell your care team if your symptoms do not start to get better or if they get worse. You may need blood work done while you are taking this medication. You may need to follow a special diet. Talk to your care team. Foods that contain iron include: whole grains/cereals, dried fruits, beans, or peas, leafy green vegetables, and organ meats (liver, kidney). What side effects may I notice from receiving this medication? Side effects that you should report to your care team as soon as  possible: Allergic reactions--skin rash, itching, hives, swelling of the face, lips, tongue, or throat Low blood pressure--dizziness, feeling faint or lightheaded, blurry vision Shortness of breath Side effects that usually do not require medical attention (report to your care team if they continue or are bothersome): Flushing Headache Joint pain Muscle pain Nausea Pain, redness, or irritation at injection site This list may not describe all possible side effects. Call your doctor for medical advice about side effects. You may report side effects to FDA at 1-800-FDA-1088. Where should I keep my medication? This medication is given in a hospital or clinic. It will not be stored at home. NOTE: This sheet is a summary. It may not cover all possible information. If you have questions about this medicine, talk to your doctor, pharmacist, or health care provider.  2024 Elsevier/Gold Standard (2023-05-11 00:00:00)

## 2023-09-10 ENCOUNTER — Inpatient Hospital Stay: Payer: BC Managed Care – PPO

## 2023-09-10 DIAGNOSIS — K50919 Crohn's disease, unspecified, with unspecified complications: Secondary | ICD-10-CM

## 2023-09-10 DIAGNOSIS — D539 Nutritional anemia, unspecified: Secondary | ICD-10-CM

## 2023-09-10 NOTE — Progress Notes (Signed)
Per Dr Leonides Schanz, too soon to give B12 injection since patient received 2 days ago. Send patient home without injection.

## 2023-09-17 ENCOUNTER — Inpatient Hospital Stay: Payer: BC Managed Care – PPO

## 2023-09-17 VITALS — BP 126/75 | HR 77 | Temp 98.0°F | Resp 17

## 2023-09-17 DIAGNOSIS — D509 Iron deficiency anemia, unspecified: Secondary | ICD-10-CM | POA: Diagnosis not present

## 2023-09-17 DIAGNOSIS — K50919 Crohn's disease, unspecified, with unspecified complications: Secondary | ICD-10-CM

## 2023-09-17 DIAGNOSIS — D539 Nutritional anemia, unspecified: Secondary | ICD-10-CM

## 2023-09-17 MED ORDER — CYANOCOBALAMIN 1000 MCG/ML IJ SOLN
1000.0000 ug | Freq: Once | INTRAMUSCULAR | Status: AC
  Administered 2023-09-17: 1000 ug via INTRAMUSCULAR
  Filled 2023-09-17: qty 1

## 2023-10-15 ENCOUNTER — Inpatient Hospital Stay: Payer: BC Managed Care – PPO

## 2023-10-16 ENCOUNTER — Inpatient Hospital Stay: Payer: BC Managed Care – PPO | Attending: Physician Assistant

## 2023-10-16 DIAGNOSIS — K50919 Crohn's disease, unspecified, with unspecified complications: Secondary | ICD-10-CM

## 2023-10-16 DIAGNOSIS — D519 Vitamin B12 deficiency anemia, unspecified: Secondary | ICD-10-CM | POA: Diagnosis present

## 2023-10-16 DIAGNOSIS — D539 Nutritional anemia, unspecified: Secondary | ICD-10-CM

## 2023-10-16 MED ORDER — CYANOCOBALAMIN 1000 MCG/ML IJ SOLN
1000.0000 ug | Freq: Once | INTRAMUSCULAR | Status: AC
  Administered 2023-10-16: 1000 ug via INTRAMUSCULAR
  Filled 2023-10-16: qty 1

## 2023-11-12 ENCOUNTER — Telehealth: Payer: Self-pay | Admitting: Hematology and Oncology

## 2023-11-12 ENCOUNTER — Inpatient Hospital Stay: Payer: BC Managed Care – PPO

## 2023-11-12 ENCOUNTER — Inpatient Hospital Stay: Payer: BC Managed Care – PPO | Attending: Physician Assistant

## 2023-11-12 DIAGNOSIS — K50919 Crohn's disease, unspecified, with unspecified complications: Secondary | ICD-10-CM

## 2023-11-12 DIAGNOSIS — E538 Deficiency of other specified B group vitamins: Secondary | ICD-10-CM | POA: Diagnosis present

## 2023-11-12 DIAGNOSIS — D539 Nutritional anemia, unspecified: Secondary | ICD-10-CM

## 2023-11-12 MED ORDER — CYANOCOBALAMIN 1000 MCG/ML IJ SOLN
1000.0000 ug | Freq: Once | INTRAMUSCULAR | Status: AC
  Administered 2023-11-12: 1000 ug via INTRAMUSCULAR
  Filled 2023-11-12: qty 1

## 2023-12-03 ENCOUNTER — Inpatient Hospital Stay: Payer: BC Managed Care – PPO

## 2023-12-03 ENCOUNTER — Telehealth: Payer: Self-pay | Admitting: *Deleted

## 2023-12-03 ENCOUNTER — Inpatient Hospital Stay: Payer: BC Managed Care – PPO | Attending: Physician Assistant

## 2023-12-03 ENCOUNTER — Ambulatory Visit: Payer: BC Managed Care – PPO

## 2023-12-03 NOTE — Telephone Encounter (Signed)
TCT patient regarding his appt today. Spoke with him. Advised that he missed his injection appt today. He states he was unaware of this appt and thought he would be doing these at home. Advised that there is no prescription noted for his B12 injections at home. Advised I would discuss with Dr. Leonides Schanz. Pt states he would like to get rescheduled this week for the B12 and then start the monthly injections at home in January 2025. He uses [facility] [address].  Dr. Leonides Schanz made aware.

## 2023-12-21 ENCOUNTER — Telehealth: Payer: Self-pay | Admitting: *Deleted

## 2023-12-21 NOTE — Telephone Encounter (Signed)
 Received call from pt. He states he had labs done at an outside lab. HGB 12.8 but Ferritin is 3.6 Pt states he is very fatigued, lightheaded. Reviewed labs with Dr. Federico and Dr. Federico will order IV iron to be given @ [facility]. Pt made aware and is agreeable to this.

## 2023-12-24 ENCOUNTER — Other Ambulatory Visit: Payer: Self-pay | Admitting: Hematology and Oncology

## 2023-12-25 ENCOUNTER — Telehealth: Payer: Self-pay | Admitting: Pharmacy Technician

## 2023-12-25 NOTE — Telephone Encounter (Signed)
 Dr. Federico, [facility] note:  Patient will be scheduled as soon as possible.  Auth Submission: NO AUTH NEEDED Site of care: Site of care: [facility] Payer: [employer] Medication & CPT/J Code(s) submitted: Feraheme  (ferumoxytol ) [id number] Route of submission (phone, fax, portal):  Phone # Fax # Auth type: Buy/Bill PB Units/visits requested: 2 Reference number:  Approval from: 12/25/23 to 04/23/24

## 2024-01-01 ENCOUNTER — Ambulatory Visit: Payer: BC Managed Care – PPO

## 2024-01-01 VITALS — BP 117/75 | HR 78 | Temp 98.2°F | Resp 20 | Ht 72.0 in | Wt 224.6 lb

## 2024-01-01 DIAGNOSIS — D509 Iron deficiency anemia, unspecified: Secondary | ICD-10-CM

## 2024-01-01 DIAGNOSIS — D539 Nutritional anemia, unspecified: Secondary | ICD-10-CM | POA: Diagnosis not present

## 2024-01-01 MED ORDER — FERUMOXYTOL INJECTION 510 MG/17 ML
510.0000 mg | Freq: Once | INTRAVENOUS | Status: AC
  Administered 2024-01-01: 510 mg via INTRAVENOUS
  Filled 2024-01-01: qty 17

## 2024-01-01 MED ORDER — ACETAMINOPHEN 325 MG PO TABS: 650.0000 mg | ORAL_TABLET | Freq: Once | ORAL | Status: AC

## 2024-01-01 MED ORDER — DIPHENHYDRAMINE HCL 25 MG PO CAPS: 25.0000 mg | ORAL_CAPSULE | Freq: Once | ORAL | Status: AC

## 2024-01-01 NOTE — Progress Notes (Signed)
 Diagnosis: Iron Deficiency Anemia  Provider:  Praveen Mannam MD  Procedure: IV Infusion  IV Type: Peripheral, IV Location: L Antecubital  Feraheme  (Ferumoxytol ), Dose: 510 mg  Infusion Start Time: 0851  Infusion Stop Time: 0910  Post Infusion IV Care: Patient declined observation and Peripheral IV Discontinued  Discharge: Condition: Good, Destination: Home . AVS Declined  Performed by:  Ashby Beery, RN     Patient refused pre-medications. Nurse educated patient and stressed the importance of taking pre-medications as a precaution in the event of a medication reaction. Patient verbalized understanding.

## 2024-01-01 NOTE — Patient Instructions (Signed)
 Ferumoxytol Injection What is this medication? FERUMOXYTOL (FER ue MOX i tol) treats low levels of iron in your body (iron deficiency anemia). Iron is a mineral that plays an important role in making red blood cells, which carry oxygen from your lungs to the rest of your body. This medicine may be used for other purposes; ask your health care provider or pharmacist if you have questions. COMMON BRAND NAME(S): Feraheme What should I tell my care team before I take this medication? They need to know if you have any of these conditions: Anemia not caused by low iron levels High levels of iron in the blood Magnetic resonance imaging (MRI) test scheduled An unusual or allergic reaction to iron, other medications, foods, dyes, or preservatives Pregnant or trying to get pregnant Breastfeeding How should I use this medication? This medication is injected into a vein. It is given by your care team in a hospital or clinic setting. Talk to your care team the use of this medication in children. Special care may be needed. Overdosage: If you think you have taken too much of this medicine contact a poison control center or emergency room at once. NOTE: This medicine is only for you. Do not share this medicine with others. What if I miss a dose? It is important not to miss your dose. Call your care team if you are unable to keep an appointment. What may interact with this medication? Other iron products This list may not describe all possible interactions. Give your health care provider a list of all the medicines, herbs, non-prescription drugs, or dietary supplements you use. Also tell them if you smoke, drink alcohol, or use illegal drugs. Some items may interact with your medicine. What should I watch for while using this medication? Visit your care team regularly. Tell your care team if your symptoms do not start to get better or if they get worse. You may need blood work done while you are taking this  medication. You may need to follow a special diet. Talk to your care team. Foods that contain iron include: whole grains/cereals, dried fruits, beans, or peas, leafy green vegetables, and organ meats (liver, kidney). What side effects may I notice from receiving this medication? Side effects that you should report to your care team as soon as possible: Allergic reactions--skin rash, itching, hives, swelling of the face, lips, tongue, or throat Low blood pressure--dizziness, feeling faint or lightheaded, blurry vision Shortness of breath Side effects that usually do not require medical attention (report to your care team if they continue or are bothersome): Flushing Headache Joint pain Muscle pain Nausea Pain, redness, or irritation at injection site This list may not describe all possible side effects. Call your doctor for medical advice about side effects. You may report side effects to FDA at 1-800-FDA-1088. Where should I keep my medication? This medication is given in a hospital or clinic. It will not be stored at home. NOTE: This sheet is a summary. It may not cover all possible information. If you have questions about this medicine, talk to your doctor, pharmacist, or health care provider.  2024 Elsevier/Gold Standard (2023-05-11 00:00:00)

## 2024-01-08 ENCOUNTER — Ambulatory Visit: Payer: BC Managed Care – PPO

## 2024-01-08 VITALS — BP 106/62 | HR 68 | Temp 98.0°F | Resp 18 | Ht 72.0 in | Wt 222.4 lb

## 2024-01-08 DIAGNOSIS — D539 Nutritional anemia, unspecified: Secondary | ICD-10-CM

## 2024-01-08 DIAGNOSIS — D509 Iron deficiency anemia, unspecified: Secondary | ICD-10-CM | POA: Diagnosis not present

## 2024-01-08 MED ORDER — SODIUM CHLORIDE 0.9 % IV SOLN
510.0000 mg | Freq: Once | INTRAVENOUS | Status: AC
  Administered 2024-01-08: 510 mg via INTRAVENOUS
  Filled 2024-01-08: qty 17

## 2024-01-08 NOTE — Patient Instructions (Signed)
 Ian Rojas

## 2024-01-08 NOTE — Progress Notes (Signed)
Diagnosis: Iron Deficiency Anemia  Provider:  Chilton Greathouse MD  Procedure: IV Infusion  IV Type: Peripheral, IV Location: L Antecubital  Feraheme (Ferumoxytol), Dose: 510 mg  Infusion Start Time: 0842  Infusion Stop Time: 0904  Post Infusion IV Care: Patient declined observation and Peripheral IV Discontinued  Discharge: Condition: Good, Destination: Home . AVS Declined  Performed by:  Adriana Mccallum, RN

## 2024-01-14 ENCOUNTER — Inpatient Hospital Stay: Payer: BC Managed Care – PPO | Attending: Physician Assistant

## 2024-02-04 ENCOUNTER — Inpatient Hospital Stay: Payer: BC Managed Care – PPO | Attending: Physician Assistant

## 2024-02-05 ENCOUNTER — Telehealth: Payer: Self-pay | Admitting: Hematology and Oncology

## 2024-02-18 ENCOUNTER — Other Ambulatory Visit: Payer: BC Managed Care – PPO

## 2024-02-18 ENCOUNTER — Ambulatory Visit: Payer: BC Managed Care – PPO

## 2024-02-18 ENCOUNTER — Ambulatory Visit: Payer: BC Managed Care – PPO | Admitting: Hematology and Oncology

## 2024-03-03 ENCOUNTER — Inpatient Hospital Stay (HOSPITAL_BASED_OUTPATIENT_CLINIC_OR_DEPARTMENT_OTHER): Payer: BC Managed Care – PPO | Admitting: Hematology and Oncology

## 2024-03-03 ENCOUNTER — Inpatient Hospital Stay: Payer: BC Managed Care – PPO | Attending: Physician Assistant

## 2024-03-03 ENCOUNTER — Inpatient Hospital Stay: Payer: BC Managed Care – PPO

## 2024-03-03 VITALS — BP 136/73 | HR 77 | Temp 97.9°F | Resp 14 | Wt 224.6 lb

## 2024-03-03 DIAGNOSIS — K909 Intestinal malabsorption, unspecified: Secondary | ICD-10-CM | POA: Diagnosis not present

## 2024-03-03 DIAGNOSIS — D539 Nutritional anemia, unspecified: Secondary | ICD-10-CM

## 2024-03-03 DIAGNOSIS — D519 Vitamin B12 deficiency anemia, unspecified: Secondary | ICD-10-CM | POA: Diagnosis present

## 2024-03-03 DIAGNOSIS — D508 Other iron deficiency anemias: Secondary | ICD-10-CM

## 2024-03-03 DIAGNOSIS — K50919 Crohn's disease, unspecified, with unspecified complications: Secondary | ICD-10-CM

## 2024-03-03 DIAGNOSIS — D509 Iron deficiency anemia, unspecified: Secondary | ICD-10-CM | POA: Diagnosis not present

## 2024-03-03 DIAGNOSIS — E538 Deficiency of other specified B group vitamins: Secondary | ICD-10-CM | POA: Diagnosis not present

## 2024-03-03 DIAGNOSIS — K50918 Crohn's disease, unspecified, with other complication: Secondary | ICD-10-CM | POA: Insufficient documentation

## 2024-03-03 LAB — CBC WITH DIFFERENTIAL (CANCER CENTER ONLY)
Abs Immature Granulocytes: 0.05 10*3/uL (ref 0.00–0.07)
Basophils Absolute: 0.1 10*3/uL (ref 0.0–0.1)
Basophils Relative: 1 %
Eosinophils Absolute: 0.3 10*3/uL (ref 0.0–0.5)
Eosinophils Relative: 3 %
HCT: 40.9 % (ref 39.0–52.0)
Hemoglobin: 13 g/dL (ref 13.0–17.0)
Immature Granulocytes: 1 %
Lymphocytes Relative: 20 %
Lymphs Abs: 1.5 10*3/uL (ref 0.7–4.0)
MCH: 23.9 pg — ABNORMAL LOW (ref 26.0–34.0)
MCHC: 31.8 g/dL (ref 30.0–36.0)
MCV: 75.3 fL — ABNORMAL LOW (ref 80.0–100.0)
Monocytes Absolute: 0.9 10*3/uL (ref 0.1–1.0)
Monocytes Relative: 12 %
Neutro Abs: 4.7 10*3/uL (ref 1.7–7.7)
Neutrophils Relative %: 63 %
Platelet Count: 454 10*3/uL — ABNORMAL HIGH (ref 150–400)
RBC: 5.43 MIL/uL (ref 4.22–5.81)
RDW: 20.5 % — ABNORMAL HIGH (ref 11.5–15.5)
WBC Count: 7.4 10*3/uL (ref 4.0–10.5)
nRBC: 0 % (ref 0.0–0.2)

## 2024-03-03 LAB — IRON AND IRON BINDING CAPACITY (CC-WL,HP ONLY)
Iron: 33 ug/dL — ABNORMAL LOW (ref 45–182)
Saturation Ratios: 9 % — ABNORMAL LOW (ref 17.9–39.5)
TIBC: 367 ug/dL (ref 250–450)
UIBC: 334 ug/dL (ref 117–376)

## 2024-03-03 LAB — FERRITIN: Ferritin: 18 ng/mL — ABNORMAL LOW (ref 24–336)

## 2024-03-03 LAB — VITAMIN B12: Vitamin B-12: 97 pg/mL — ABNORMAL LOW (ref 180–914)

## 2024-03-03 MED ORDER — CYANOCOBALAMIN 1000 MCG/ML IJ SOLN
1000.0000 ug | Freq: Once | INTRAMUSCULAR | Status: AC
  Administered 2024-03-03: 1000 ug via INTRAMUSCULAR
  Filled 2024-03-03: qty 1

## 2024-03-03 NOTE — Progress Notes (Unsigned)
 [facility] [phone]   [phone]  PROGRESS NOTE  Patient Care Team: Stacie Glaze, MD as PCP - General (Internal Medicine) Jaci Standard, MD as Consulting Physician (Hematology and Oncology)   CHIEF COMPLAINTS/PURPOSE OF CONSULTATION:  Iron deficiency and vitamin B12 deficiency anemia 2/2 Crohn's disease  TREATMENT: --12/04/2022: Received IV feraheme 510 mg x 1 dose and Vitamin B12 injection x 1 dose  HISTORY OF PRESENTING ILLNESS:  Ian Rojas 33 y.o. male returns for a follow up for iron and B12 deficiency anemia. He was last seen on 08/22/2023. In the interim, he receive one dose of IV feraheme and B12 IM injection. He is unaccompanied for this visit.   On exam today, Ian Rojas reports he has been "okay" in the interim since her last visit.  He reports he is not currently having any leg cramps.  He reports that he did receive an iron infusion in January 2025 and tolerated it well.  He reports that he is not having any current lightheadedness, dizziness, or shortness of breath.  He reports that his Crohn's does begin to flare about 1 week before treatment, and he is due for treatment tomorrow.  He notes he has not seen any evidence of blood in the stool or bleeding elsewhere such as nosebleeds, gum bleeding, or dark stools.  He reports that he continue receiving his vitamin B12 shots once per month.  He would also like his testosterone levels checked because he feels like his body is slowing down.  He denies fevers, chills, sweats, chest pain or cough. He has no other complaints. Rest of the ROS is below.   MEDICAL HISTORY:  Past Medical History:  Diagnosis Date   Esophageal dysmotility    Gastritis    GERD (gastroesophageal reflux disease)    Hepatic steatosis    severe with focal faty sparing   Seasonal allergies     SURGICAL HISTORY: Past Surgical History:  Procedure Laterality Date   CIRCUMCISION      SOCIAL  HISTORY: Social History   Socioeconomic History   Marital status: Single    Spouse name: Not on file   Number of children: Not on file   Years of education: Not on file   Highest education level: Not on file  Occupational History   Not on file  Tobacco Use   Smoking status: Never   Smokeless tobacco: Never  Substance and Sexual Activity   Alcohol use: Yes    Comment: OCCASIONAL-VERY RARE   Drug use: No   Sexual activity: Not on file  Other Topics Concern   Not on file  Social History Narrative   Not on file   Social Drivers of Health   Financial Resource Strain: Low Risk  (10/17/2022)   [facility] visits prior to 02/17/2023., [facility] visits prior to [facility]17/[facility]23[facility]   Overall Financial Resource Strain (CARDIA)    Difficulty of Paying Living Expenses: Not very hard  Food Insecurity: No Food Insecurity (10/17/2022)   [facility] prior to 02/17/2023., [facility] visits prior to 02/17/2023., [facility]   Hunger Vital Sign    Worried About Running Out of Food in the Last Year: Never true    Ran Out of Food in the Last Year: Never true  Transportation Needs: No Transportation Needs (10/17/2022)   [facility] prior  to 02/17/2023., [facility] to [facility]17/[facility]23., [facility]   PRAPARE - Transportation    Lack of Transportation (Medical): No    Lack of Transportation (Non-Medical): No  Physical Activity: Inactive (10/17/2022)   [facility] visits prior to 02/17/2023., [facility] prior to 02/17/2023[facility]   Exercise Vital Sign    Days of Exercise per Week: 0 days    Minutes of Exercise per Session: 0 min  Stress: Stress Concern Present (10/17/2022)   [facility] visits  prior to 02/17/2023., [facility] visits prior to 02/17/2023[facility]   [employer] of Occupational Health - Occupational Stress Questionnaire    Feeling of Stress : To some extent  Social Connections: Moderately Integrated (10/17/2022)   Received from [facility] visits prior to 02/17/2023., [facility] visits prior to 02/17/2023., [facility]   Social Connection and Isolation Panel [NHANES]    Frequency of Communication with Friends and Family: More than three times a week    Frequency of Social Gatherings with Friends and Family: More than three times a week    Attends Religious Services: Never    Database administrator or Organizations: Yes    Attends Banker Meetings: Never    Marital Status: Married  Catering manager Violence: Not At Risk (10/17/2022)   [facility] prior to 02/17/2023., [facility] visits prior to 02/17/2023.   Humiliation, Afraid, Rape, and Kick questionnaire    Fear of Current or Ex-Partner: No    Emotionally Abused: No    Physically Abused: No    Sexually Abused: No    FAMILY HISTORY: Family History  Problem Relation Age of Onset   Colon cancer Neg Hx    Colitis Father        AS CHILD   Irritable bowel syndrome Paternal Grandfather     ALLERGIES:  has no known allergies.  MEDICATIONS:  Current Outpatient Medications  Medication Sig Dispense Refill   Adalimumab (HUMIRA PEN [place]) Inject 0.4 mg into the skin once a week.     famotidine (PEPCID) 20 MG tablet Take 1 tablet (20 mg total) by mouth 2 (two) times daily. (Patient taking differently: Take 20 mg by mouth 2 (two) times daily as needed.) 30 tablet 0   pantoprazole (PROTONIX) 40 MG tablet TAKE 1 TABLET (40 MG TOTAL) BY MOUTH DAILY. 30 tablet 3   Current Facility-Administered Medications  Medication Dose Route Frequency Provider Last Rate Last Admin    acetaminophen (TYLENOL) tablet 650 mg  650 mg Oral Once Jaci Standard, MD       diphenhydrAMINE (BENADRYL) capsule 25 mg  25 mg Oral Once Jaci Standard, MD        REVIEW OF SYSTEMS:   Constitutional: ( - ) fevers, ( - )  chills , ( - ) night sweats Eyes: ( - ) blurriness of vision, ( - ) double vision, ( - ) watery eyes Ears, nose, mouth, throat, and face: ( - ) mucositis, ( - ) sore throat Respiratory: ( - ) cough, ( +) dyspnea, ( - ) wheezes Cardiovascular: ( - ) palpitation, ( - ) chest discomfort, ( - ) lower extremity swelling Gastrointestinal:  ( - ) nausea, ( - ) heartburn, ( +) change in bowel habits Skin: ( - )  abnormal skin rashes Lymphatics: ( - ) new lymphadenopathy, ( - ) easy bruising Neurological: ( - ) numbness, ( - ) tingling, ( - ) new weaknesses Behavioral/Psych: ( - ) mood change, ( - ) new changes  All other systems were reviewed with the patient and are negative.  PHYSICAL EXAMINATION: ECOG PERFORMANCE STATUS: 1 - Symptomatic but completely ambulatory  Vitals:   03/03/24 1017  BP: 136/73  Pulse: 77  Resp: 14  Temp: 97.9 F (36.6 C)  SpO2: 99%    Filed Weights   03/03/24 1017  Weight: 224 lb 9.6 oz (101.9 kg)     GENERAL: well appearing male in NAD  SKIN: skin color, texture, turgor are normal, no rashes or significant lesions EYES: conjunctiva are pink and non-injected, sclera clear LUNGS: clear to auscultation and percussion with normal breathing effort HEART: regular rate & rhythm and no murmurs and no lower extremity edema Musculoskeletal: no cyanosis of digits and no clubbing  PSYCH: alert & oriented x 3, fluent speech NEURO: no focal motor/sensory deficits  LABORATORY DATA:  I have reviewed the data as listed    Latest Ref Rng & Units 03/03/2024    9:58 AM 08/22/2023   10:34 AM 11/24/2022   12:18 PM  CBC  WBC 4.0 - 10.5 K/uL 7.4  9.8  9.5   Hemoglobin 13.0 - 17.0 g/dL 21.3  08.6  57.8   Hematocrit 39.0 - 52.0 % 40.9  37.7  41.8    Platelets 150 - 400 K/uL 454  535  487        Latest Ref Rng & Units 02/29/2016    2:56 PM 01/10/2013   11:04 PM  CMP  Glucose 70 - 99 mg/dL  469   BUN 6 - 23 mg/dL  11   Creatinine 6.29 - 1.35 mg/dL  5.28   Sodium 413 - 244 mEq/L  133   Potassium 3.5 - 5.1 mEq/L  3.9   Chloride 96 - 112 mEq/L  98   CO2 19 - 32 mEq/L  23   Calcium 8.4 - 10.5 mg/dL  9.4   Total Protein 6.0 - 8.3 g/dL 7.9  8.2   Total Bilirubin 0.2 - 1.2 mg/dL 0.3  0.2   Alkaline Phos 39 - 117 U/L 52  66   AST 0 - 37 U/L 27  43   ALT 0 - 53 U/L 39  51     ASSESSMENT & PLAN Ian Rojas is a 33 y.o. male who presents for a follow up for iron and B12 deficiency anemia 2/2 Crohn's disease.  Patient will transfer care from Dr. Angelene Giovanni to Dr. Leonides Schanz.   #Iron deficiency anemia #Vitamin B12 deficiency --Secondary to blood loss and malabsorption from Crohn's disease. Under the care of Dr. Ewing Schlein at [facility]. Currently on Humira but planning to switch to Remicade.  -- received IV feraheme 510 mg x 1 dose and IM Vitamin B12 injection 1000 mg x 1 dose on 12/04/2022. Last dose on 1/14 and 01/08/2024.  --Labs today show white blood cell 7.4, hemoglobin 13.0, MCV 75.3, platelets 454 --Patient is unable tolerate iron pills and has tried liquid iron recently. --Will plan to arrange for IV feraheme 510 mg x 2 doses. Will plan to arrange for weekly B12 injections --RTC in 3 months for lab check and 6 months for labs/follow up  Orders Placed This Encounter  Procedures   Testosterone    Standing Status:   Future    Number of Occurrences:  1    Expiration Date:   03/04/2025    All questions were answered. The patient knows to call the clinic with any problems, questions or concerns.  I have spent a total of 30 minutes minutes of face-to-face and non-face-to-face time, preparing to see the patient, performing a medically appropriate examination, counseling and educating the patient, ordering medications/tests/procedures,  documenting clinical information in the electronic health record, independently interpreting results and communicating results to the patient, and care coordination.   Ulysees Barns, MD Department of Hematology/Oncology [facility] Phone: [phone] Pager: [phone] Email: [email or web address]

## 2024-03-05 LAB — TESTOSTERONE: Testosterone: 536 ng/dL (ref 264–916)

## 2024-03-06 ENCOUNTER — Other Ambulatory Visit: Payer: Self-pay | Admitting: Hematology and Oncology

## 2024-03-06 ENCOUNTER — Encounter: Payer: Self-pay | Admitting: Hematology and Oncology

## 2024-03-06 ENCOUNTER — Telehealth: Payer: Self-pay | Admitting: Pharmacy Technician

## 2024-03-06 ENCOUNTER — Telehealth: Payer: Self-pay | Admitting: *Deleted

## 2024-03-06 ENCOUNTER — Encounter: Payer: Self-pay | Admitting: Physician Assistant

## 2024-03-06 MED ORDER — SYRINGE (DISPOSABLE) 1 ML MISC
1.0000 mL | 1 refills | Status: AC
Start: 2024-03-06 — End: ?

## 2024-03-06 MED ORDER — CYANOCOBALAMIN 1000 MCG/ML IJ SOLN: 1000.0000 ug | INTRAMUSCULAR | 4 refills | Status: AC

## 2024-03-06 NOTE — Telephone Encounter (Signed)
-----   Message from Ulysees Barns IV sent at 03/06/2024  9:41 AM EDT ----- Please let Mr. Bulkley know:  1) his iron levels are still very low. I would recommend another round of IV feraheme x 2 doses. Order has been placed.  2) his Vitamin b12 is quite low as well. If he is agreeable, I will schedule him for weekly vitamin b12 x 8 doses to help bolster his levels. He did mention he would be open to giving himself injections. That is something we could arrange as well.  3) His testosterone level are within the normal range. ----- Message ----- From: Raymondo Band Sent: 03/06/2024   9:26 AM EDT To: Jaci Standard, MD  You saw patient on 3/17. ----- Message ----- From: Leory Plowman, Lab In [place] Sent: 03/03/2024  10:29 AM EDT To: Briant Cedar, PA-C

## 2024-03-06 NOTE — Telephone Encounter (Signed)
 Auth Submission: NO AUTH NEEDED Site of care: Site of care: [facility] Payer: BCBS Medication & CPT/J Code(s) submitted: Feraheme (ferumoxytol) [id number] Route of submission (phone, fax, portal):  Phone # Fax # Auth type: Buy/Bill PB Units/visits requested: 2 DOSES Reference number: [id number] Approval from: 03/06/24 to 08/06/24

## 2024-03-06 NOTE — Telephone Encounter (Signed)
 TCT patient regarding recent lab results.  Spoke to him.  Advised that Dr. Leonides Schanz recommends: "1) his iron levels are still very low. I would recommend another round of IV feraheme x 2 doses. Order has been placed.  2) his Vitamin b12 is quite low as well. If he is agreeable, I will schedule him for weekly vitamin b12 x 8 doses to help bolster his levels. He did mention he would be open to giving himself injections. That is something we could arrange as well.  3) His testosterone level are within the normal range." Pt does prefer to do his injections at home. Dr. Leonides Schanz made aware  of this. Thayer Ohm also states that he has been called about his IV iron already and those appts have been made.

## 2024-03-18 ENCOUNTER — Ambulatory Visit

## 2024-03-18 MED ORDER — FERUMOXYTOL INJECTION 510 MG/17 ML
510.0000 mg | Freq: Once | INTRAVENOUS | Status: AC
  Filled 2024-03-18: qty 17

## 2024-03-25 ENCOUNTER — Ambulatory Visit (INDEPENDENT_AMBULATORY_CARE_PROVIDER_SITE_OTHER)

## 2024-03-25 VITALS — BP 130/84 | HR 92 | Temp 98.2°F | Resp 16 | Ht 66.0 in | Wt 228.8 lb

## 2024-03-25 DIAGNOSIS — K50919 Crohn's disease, unspecified, with unspecified complications: Secondary | ICD-10-CM | POA: Diagnosis not present

## 2024-03-25 DIAGNOSIS — D500 Iron deficiency anemia secondary to blood loss (chronic): Secondary | ICD-10-CM

## 2024-03-25 DIAGNOSIS — D539 Nutritional anemia, unspecified: Secondary | ICD-10-CM

## 2024-03-25 MED ORDER — SODIUM CHLORIDE 0.9 % IV SOLN
510.0000 mg | Freq: Once | INTRAVENOUS | Status: AC
  Administered 2024-03-25: 510 mg via INTRAVENOUS
  Filled 2024-03-25: qty 17

## 2024-03-25 NOTE — Progress Notes (Signed)
 Diagnosis: Iron Deficiency Anemia  Provider:  Chilton Greathouse MD  Procedure: IV Infusion  IV Type: Peripheral, IV Location: L Antecubital  Feraheme (Ferumoxytol), Dose: 510 mg  Infusion Start Time: 0854  Infusion Stop Time: 0913  Post Infusion IV Care: Patient declined observation and Peripheral IV Discontinued  Discharge: Condition: Good, Destination: Home . AVS Declined  Performed by:  Rico Ala, LPN

## 2024-04-01 ENCOUNTER — Ambulatory Visit (INDEPENDENT_AMBULATORY_CARE_PROVIDER_SITE_OTHER)

## 2024-04-01 VITALS — BP 134/73 | HR 81 | Temp 98.0°F | Resp 16 | Ht 72.0 in | Wt 227.6 lb

## 2024-04-01 DIAGNOSIS — D500 Iron deficiency anemia secondary to blood loss (chronic): Secondary | ICD-10-CM | POA: Diagnosis not present

## 2024-04-01 DIAGNOSIS — K50919 Crohn's disease, unspecified, with unspecified complications: Secondary | ICD-10-CM

## 2024-04-01 DIAGNOSIS — D539 Nutritional anemia, unspecified: Secondary | ICD-10-CM

## 2024-04-01 MED ORDER — SODIUM CHLORIDE 0.9 % IV SOLN
510.0000 mg | Freq: Once | INTRAVENOUS | Status: AC
  Administered 2024-04-01: 510 mg via INTRAVENOUS
  Filled 2024-04-01: qty 17

## 2024-04-01 NOTE — Progress Notes (Signed)
 Diagnosis: Iron Deficiency Anemia  Provider:  Praveen Mannam MD  Procedure: IV Infusion  IV Type: Peripheral, IV Location: L Antecubital  Feraheme (Ferumoxytol), Dose: 510 mg  Infusion Start Time: 0904  Infusion Stop Time: 0922  Post Infusion IV Care: Patient declined observation and Peripheral IV Discontinued  Discharge: Condition: Good, Destination: Home . AVS Declined  Performed by:  Lauran Pollard, LPN

## 2024-05-26 ENCOUNTER — Encounter: Payer: Self-pay | Admitting: Physician Assistant

## 2024-06-02 ENCOUNTER — Inpatient Hospital Stay: Attending: Physician Assistant

## 2024-06-06 ENCOUNTER — Telehealth: Payer: Self-pay | Admitting: Hematology and Oncology

## 2024-06-06 NOTE — Telephone Encounter (Signed)
 Scheduled appointment per 6/20 secure chat from the nurse. Called and left a VM with appointment details for the patient.

## 2024-06-09 ENCOUNTER — Inpatient Hospital Stay

## 2024-06-09 ENCOUNTER — Inpatient Hospital Stay: Admitting: Hematology and Oncology

## 2024-06-09 NOTE — Progress Notes (Deleted)
 [facility] [phone]   [phone]  PROGRESS NOTE  Patient Care Team: Mavis Norleen BRAVO, MD as PCP - General (Internal Medicine) Federico Norleen ONEIDA MADISON, MD as Consulting Physician (Hematology and Oncology)   CHIEF COMPLAINTS/PURPOSE OF CONSULTATION:  Iron deficiency and vitamin B12 deficiency anemia 2/2 Crohn's disease  TREATMENT: --12/04/2022: Received IV feraheme  510 mg x 1 dose and Vitamin B12 injection x 1 dose  HISTORY OF PRESENTING ILLNESS:  Ian Rojas 33 y.o. male returns for a follow up for iron and B12 deficiency anemia. He was last seen on 03/03/2024. In the interim, he receive one dose of IV feraheme  and B12 IM injection. He is unaccompanied for this visit.   On exam today, Ian Rojas reports ***  MEDICAL HISTORY:  Past Medical History:  Diagnosis Date   Esophageal dysmotility    Gastritis    GERD (gastroesophageal reflux disease)    Hepatic steatosis    severe with focal faty sparing   Seasonal allergies     SURGICAL HISTORY: Past Surgical History:  Procedure Laterality Date   CIRCUMCISION      SOCIAL HISTORY: Social History   Socioeconomic History   Marital status: Single    Spouse name: Not on file   Number of children: Not on file   Years of education: Not on file   Highest education level: Not on file  Occupational History   Not on file  Tobacco Use   Smoking status: Never   Smokeless tobacco: Never  Substance and Sexual Activity   Alcohol use: Yes    Comment: OCCASIONAL-VERY RARE   Drug use: No   Sexual activity: Not on file  Other Topics Concern   Not on file  Social History Narrative   Not on file   Social Drivers of Health   Financial Resource Strain: Low Risk  (10/17/2022)   Received [facility] prior to [facility]17[facility]   Overall Financial Resource Strain (CARDIA)    Difficulty of Paying Living Expenses: Not very hard  Food Insecurity: No Food  Insecurity (10/17/2022)   [facility] to [facility]17/[facility]   Hunger Vital Sign    Worried About Running Out of Food in the Last Year: Never true    Ran Out of Food in the Last Year: Never true  Transportation Needs: No Transportation Needs (10/17/2022)   Received [facility] visits prior to [facility]17/2023[facility]   PRAPARE - Transportation    Lack of Transportation (Medical): No    Lack of Transportation (Non-Medical): No  Physical Activity: Inactive (10/17/2022)   [facility] to [facility]17/[facility]   Exercise Vital Sign    On average, how many days per week do you engage in moderate to strenuous exercise (like a brisk walk)?: 0 days    On average, how many minutes do you engage in exercise at this level?: 0 min  Stress: Stress Concern Present (10/17/2022)   Received [facility] visits prior to [facility]17/2023[facility]   [employer] of Occupational Health - Occupational Stress Questionnaire    Feeling of Stress : To some extent  Social Connections: Moderately Integrated (10/17/2022)   Received from [facility] prior to [facility]17/20[facility]   Social Connection and Isolation Panel    In a typical week, how many times do  you talk on the phone with family, friends, or neighbors?: More than three times a week    How often do you get together with friends or relatives?: More than three times a week    How often do you attend church or religious services?: Never    Do you belong to any clubs or organizations such as church groups, unions, fraternal or athletic groups, or school groups?: Yes    How often do you attend meetings of the clubs or organizations you belong to?: Never    Are you married, widowed, divorced, separated, never married, or living with a partner?: Married  Intimate Partner  Violence: Not At Risk (10/17/2022)   [facility] visits prior to 02/17/2023.   Humiliation, Afraid, Rape, and Kick questionnaire    Fear of Current or Ex-Partner: No    Emotionally Abused: No    Physically Abused: No    Sexually Abused: No    FAMILY HISTORY: Family History  Problem Relation Age of Onset   Colon cancer Neg Hx    Colitis Father        AS CHILD   Irritable bowel syndrome Paternal Grandfather     ALLERGIES:  has no known allergies.  MEDICATIONS:  Current Outpatient Medications  Medication Sig Dispense Refill   Adalimumab (HUMIRA PEN [place]) Inject 0.4 mg into the skin once a week.     cyanocobalamin  (VITAMIN B12) 1000 MCG/ML injection Inject 1 mL (1,000 mcg total) into the muscle once a week. 10 mL 4   famotidine  (PEPCID ) 20 MG tablet Take 1 tablet (20 mg total) by mouth 2 (two) times daily. (Patient taking differently: Take 20 mg by mouth 2 (two) times daily as needed.) 30 tablet 0   pantoprazole  (PROTONIX ) 40 MG tablet TAKE 1 TABLET (40 MG TOTAL) BY MOUTH DAILY. 30 tablet 3   Syringe, Disposable, 1 ML MISC Inject 1 mL into the muscle once a week. 25 each 1   Current Facility-Administered Medications  Medication Dose Route Frequency Provider Last Rate Last Admin   acetaminophen  (TYLENOL ) tablet 650 mg  650 mg Oral Once Wei Newbrough T IV, MD       diphenhydrAMINE  (BENADRYL ) capsule 25 mg  25 mg Oral Once Raschelle Wisenbaker T IV, MD       Facility-Administered Medications Ordered in Other Visits  Medication Dose Route Frequency Provider Last Rate Last Admin   ferumoxytol  (FERAHEME ) 510 mg in sodium chloride  0.9 % 100 mL IVPB  510 mg Intravenous Once Federico Norleen ONEIDA MADISON, MD        REVIEW OF SYSTEMS:   Constitutional: ( - ) fevers, ( - )  chills , ( - ) night sweats Eyes: ( - ) blurriness of vision, ( - ) double vision, ( - ) watery eyes Ears, nose, mouth, throat, and face: ( - ) mucositis, ( - ) sore throat Respiratory: ( - ) cough, ( +)  dyspnea, ( - ) wheezes Cardiovascular: ( - ) palpitation, ( - ) chest discomfort, ( - ) lower extremity swelling Gastrointestinal:  ( - ) nausea, ( - ) heartburn, ( +) change in bowel habits Skin: ( - ) abnormal skin rashes Lymphatics: ( - ) new lymphadenopathy, ( - ) easy bruising Neurological: ( - ) numbness, ( - ) tingling, ( - ) new weaknesses Behavioral/Psych: ( - ) mood change, ( - ) new changes  All other systems were reviewed with the patient and are negative.  PHYSICAL  EXAMINATION: ECOG PERFORMANCE STATUS: 1 - Symptomatic but completely ambulatory  There were no vitals filed for this visit.   There were no vitals filed for this visit.    GENERAL: well appearing male in NAD  SKIN: skin color, texture, turgor are normal, no rashes or significant lesions EYES: conjunctiva are pink and non-injected, sclera clear LUNGS: clear to auscultation and percussion with normal breathing effort HEART: regular rate & rhythm and no murmurs and no lower extremity edema Musculoskeletal: no cyanosis of digits and no clubbing  PSYCH: alert & oriented x 3, fluent speech NEURO: no focal motor/sensory deficits  LABORATORY DATA:  I have reviewed the data as listed    Latest Ref Rng & Units 03/03/2024    9:58 AM 08/22/2023   10:34 AM 11/24/2022   12:18 PM  CBC  WBC 4.0 - 10.5 K/uL 7.4  9.8  9.5   Hemoglobin 13.0 - 17.0 g/dL 86.9  88.2  86.4   Hematocrit 39.0 - 52.0 % 40.9  37.7  41.8   Platelets 150 - 400 K/uL 454  535  487        Latest Ref Rng & Units 02/29/2016    2:56 PM 01/10/2013   11:04 PM  CMP  Glucose 70 - 99 mg/dL  896   BUN 6 - 23 mg/dL  11   Creatinine 9.49 - 1.35 mg/dL  9.31   Sodium 864 - 854 mEq/L  133   Potassium 3.5 - 5.1 mEq/L  3.9   Chloride 96 - 112 mEq/L  98   CO2 19 - 32 mEq/L  23   Calcium 8.4 - 10.5 mg/dL  9.4   Total Protein 6.0 - 8.3 g/dL 7.9  8.2   Total Bilirubin 0.2 - 1.2 mg/dL 0.3  0.2   Alkaline Phos 39 - 117 U/L 52  66   AST 0 - 37 U/L 27  43   ALT  0 - 53 U/L 39  51     ASSESSMENT & PLAN Ian Rojas is a 33 y.o. male who presents for a follow up for iron and B12 deficiency anemia 2/2 Crohn's disease.  Patient will transfer care from Dr. Bernie to Dr. Federico.   #Iron deficiency anemia #Vitamin B12 deficiency --Secondary to blood loss and malabsorption from Crohn's disease. Under the care of Dr. Rosalie at [facility]. Currently on Humira but planning to switch to Remicade.  -- received IV feraheme  510 mg x 1 dose and IM Vitamin B12 injection 1000 mg x 1 dose on 12/04/2022. Last dose on 1/14 and 01/08/2024.  --Labs today show white blood cell *** --Patient is unable tolerate iron pills and has tried liquid iron recently. --Will plan to arrange for IV feraheme  510 mg x 2 doses. Will plan to arrange for weekly B12 injections --RTC in 3 months for lab check and 6 months for labs/follow up  No orders of the defined types were placed in this encounter.   All questions were answered. The patient knows to call the clinic with any problems, questions or concerns.  I have spent a total of 30 minutes minutes of face-to-face and non-face-to-face time, preparing to see the patient, performing a medically appropriate examination, counseling and educating the patient, ordering medications/tests/procedures, documenting clinical information in the electronic health record, independently interpreting results and communicating results to the patient, and care coordination.   Norleen IVAR Federico, MD Department of Hematology/Oncology [facility] at [facility] Phone: [phone] Pager: [phone] Email: [email or web address]

## 2024-08-31 ENCOUNTER — Other Ambulatory Visit: Payer: Self-pay | Admitting: Hematology and Oncology

## 2024-08-31 DIAGNOSIS — E538 Deficiency of other specified B group vitamins: Secondary | ICD-10-CM

## 2024-08-31 DIAGNOSIS — K50919 Crohn's disease, unspecified, with unspecified complications: Secondary | ICD-10-CM

## 2024-08-31 DIAGNOSIS — D539 Nutritional anemia, unspecified: Secondary | ICD-10-CM

## 2024-08-31 NOTE — Progress Notes (Signed)
 No show

## 2024-09-01 ENCOUNTER — Inpatient Hospital Stay

## 2024-09-01 ENCOUNTER — Inpatient Hospital Stay: Admitting: Hematology and Oncology

## 2024-09-01 ENCOUNTER — Inpatient Hospital Stay: Attending: Physician Assistant

## 2024-09-01 DIAGNOSIS — E538 Deficiency of other specified B group vitamins: Secondary | ICD-10-CM

## 2024-09-01 DIAGNOSIS — K50919 Crohn's disease, unspecified, with unspecified complications: Secondary | ICD-10-CM

## 2024-09-01 DIAGNOSIS — D539 Nutritional anemia, unspecified: Secondary | ICD-10-CM

## 2024-09-06 ENCOUNTER — Encounter: Payer: Self-pay | Admitting: Physician Assistant
